# Patient Record
Sex: Female | Born: 1955
Health system: Southern US, Community
[De-identification: ages and names within clinical notes are randomized; demographics above are authoritative.]

## PROBLEM LIST (undated history)

## (undated) DIAGNOSIS — E1169 Type 2 diabetes mellitus with other specified complication: Secondary | ICD-10-CM

## (undated) DIAGNOSIS — I10 Essential (primary) hypertension: Secondary | ICD-10-CM

## (undated) DIAGNOSIS — E669 Obesity, unspecified: Secondary | ICD-10-CM

## (undated) HISTORY — DX: Obesity, unspecified: E11.69

## (undated) HISTORY — DX: Obesity, unspecified: E66.9

---

## 2013-07-15 ENCOUNTER — Ambulatory Visit: Payer: Self-pay | Admitting: Interventional Cardiology

## 2013-07-16 ENCOUNTER — Ambulatory Visit (INDEPENDENT_AMBULATORY_CARE_PROVIDER_SITE_OTHER): Payer: BC Managed Care – PPO | Admitting: Interventional Cardiology

## 2013-07-16 ENCOUNTER — Encounter: Payer: Self-pay | Admitting: Interventional Cardiology

## 2013-07-16 VITALS — BP 180/92 | HR 92 | Ht 60.0 in | Wt 166.0 lb

## 2013-07-16 DIAGNOSIS — I10 Essential (primary) hypertension: Secondary | ICD-10-CM

## 2013-07-16 DIAGNOSIS — E782 Mixed hyperlipidemia: Secondary | ICD-10-CM

## 2013-07-16 DIAGNOSIS — R011 Cardiac murmur, unspecified: Secondary | ICD-10-CM | POA: Insufficient documentation

## 2013-07-16 MED ORDER — LOSARTAN POTASSIUM-HCTZ 50-12.5 MG PO TABS: 1.0000 | ORAL_TABLET | Freq: Two times a day (BID) | ORAL | Status: DC

## 2013-07-16 MED ORDER — METOPROLOL TARTRATE 25 MG PO TABS: ORAL_TABLET | ORAL | Status: DC

## 2013-07-16 NOTE — Patient Instructions (Signed)
Your physician has recommended you make the following change in your medication:   1. Start Metoprolol Tartrate 25 mg 1 tablet twice a day as needed for systolic BP greater than 150.   Your physician recommends that you return for a FASTING lipid profile and cmet on 07/22/13.   Your physician wants you to follow-up in: 6 months with Dr. Eldridge DaceVaranasi. You will receive a reminder letter in the mail two months in advance. If you don't receive a letter, please call our office to schedule the follow-up appointment.  Your physician has requested that you regularly monitor and record your blood pressure readings at home. Please use the same machine at the same time of day to check your readings and record them to bring to your follow-up visit. Call is BP is consistently above 150-140's systolic.

## 2013-07-16 NOTE — Progress Notes (Signed)
Patient ID: Elizabeth [name], female   DOB: 10/02/1955, 58 y.o.   MRN: [id number]     Patient ID: Elizabeth [name] MRN: [id number] DOB/AGE: 57/05/1955 58 y.o.   Referring Physician no PCP yet   Reason for Consultation HTN  HPI: 58 y/o who had HTN.  She describes herself as anxious and this makes the BP increase as well.  She has been taken to the hospital due to severely elevated BP.  She has trouble sleeping.  BP [place] was 130/80.  Once a month, she may have a spike as described.  She reports headache with the high readings.    She had a kidney stone and had lithotripsy.  She uses some cystone for her pain.  No CP, SHOB, edema.  Her only sx are when the BP is up.  She has had difficulty losing weight.   Her cholesterol has been high in the past.  It has not been checked recently.     Current Outpatient Prescriptions  Medication Sig Dispense Refill  . losartan-hydrochlorothiazide (HYZAAR) 50-12.5 MG per tablet Take 1 tablet by mouth 2 (two) times daily.       No current facility-administered medications for this visit.   No past medical history on file.  Family History  Problem Relation Age of Onset  . Hypertension Mother     History   Social History  . Marital Status: Married    Spouse Name: N/A    Number of Children: N/A  . Years of Education: N/A   Occupational History  . Not on file.   Social History Main Topics  . Smoking status: Never Smoker   . Smokeless tobacco: Not on file  . Alcohol Use: No  . Drug Use: Not on file  . Sexual Activity: Not on file   Other Topics Concern  . Not on file   Social History Narrative  . No narrative on file    Past Surgical History  Procedure Laterality Date  . Cesarean section        (Not in a hospital admission)  Review of systems complete and found to be negative unless listed above .  No nausea, vomiting.  No fever chills, No focal weakness,  No palpitations.  Physical Exam: Filed Vitals:   07/16/13 0828   BP: 180/92  Pulse: 92    Weight: 166 lb (75.297 kg)  Physical exam:  [place]/AT EOMI No JVD, No carotid bruit RRR S1S2 , 2/6 systolic murmur No wheezing Soft. NT, nondistended No edema. No focal motor or sensory deficits Normal affect  Labs:   No results found for this basename: WBC, HGB, HCT, MCV, PLT   No results found for this basename: NA, K, CL, CO2, BUN, CREATININE, CALCIUM, LABALBU, PROT, BILITOT, ALKPHOS, ALT, AST, GLUCOSE,  in the last 168 hours No results found for this basename: CKTOTAL, CKMB, CKMBINDEX, TROPONINI    No results found for this basename: CHOL   No results found for this basename: HDL   No results found for this basename: LDLCALC   No results found for this basename: TRIG   No results found for this basename: CHOLHDL   No results found for this basename: LDLDIRECT      Radiology:none EKG: NSR, no ST changes  ASSESSMENT AND PLAN:  HTN: Continue losartan/HCTZ 20/12.5 twice a day. We'll start metoprolol 25 mg by mouth twice a day when necessary for the blood pressure spikes that she has. These are rare, approximately once a month. Continue  regular exercise. Continue to watch salt intake.  Murmur: She has had an echocardiogram in the past. The murmur sounds innocent. She has no symptoms or signs of heart failure. We will follow clinically.  Obtain old echo result when they have it.  Anxiety: She is somewhat nervous about her 57 year old son. He was diagnosed with an enlarged heart.  They would like For me to see him at a later time.  High cholesterol: We'll check lipid panel. Signed:   Fredric MareJay S. Khamora Karan, MD, [facility] 07/16/2013, 9:17 AM

## 2013-07-22 ENCOUNTER — Other Ambulatory Visit (INDEPENDENT_AMBULATORY_CARE_PROVIDER_SITE_OTHER): Payer: BC Managed Care – PPO

## 2013-07-22 DIAGNOSIS — E782 Mixed hyperlipidemia: Secondary | ICD-10-CM

## 2013-07-22 LAB — COMPREHENSIVE METABOLIC PANEL
ALBUMIN: 3.9 g/dL (ref 3.5–5.2)
ALT: 15 U/L (ref 0–35)
AST: 16 U/L (ref 0–37)
Alkaline Phosphatase: 60 U/L (ref 39–117)
BUN: 10 mg/dL (ref 6–23)
CO2: 29 mEq/L (ref 19–32)
Calcium: 9.6 mg/dL (ref 8.4–10.5)
Chloride: 102 mEq/L (ref 96–112)
Creatinine, Ser: 0.8 mg/dL (ref 0.4–1.2)
GFR: 76.32 mL/min (ref >60.00)
GLUCOSE: 96 mg/dL (ref 70–99)
POTASSIUM: 3.3 meq/L — AB (ref 3.5–5.1)
SODIUM: 137 meq/L (ref 135–145)
TOTAL PROTEIN: 7.2 g/dL (ref 6.0–8.3)
Total Bilirubin: 0.5 mg/dL (ref 0.2–1.2)

## 2013-07-22 LAB — LIPID PANEL
CHOLESTEROL: 212 mg/dL — AB (ref 0–200)
HDL: 51.2 mg/dL (ref >39.00)
LDL Cholesterol: 129 mg/dL — ABNORMAL HIGH (ref 0–99)
TRIGLYCERIDES: 157 mg/dL — AB (ref 0.0–149.0)
Total CHOL/HDL Ratio: 4
VLDL: 31.4 mg/dL (ref 0.0–40.0)

## 2013-07-23 NOTE — Progress Notes (Signed)
Quick Note:  Preliminary report reviewed by triage nurse and sent to MD desk. ______ 

## 2013-07-27 ENCOUNTER — Other Ambulatory Visit: Payer: Self-pay | Admitting: Cardiology

## 2013-07-27 DIAGNOSIS — E876 Hypokalemia: Secondary | ICD-10-CM

## 2013-07-27 MED ORDER — POTASSIUM CHLORIDE CRYS ER 20 MEQ PO TBCR: 20.0000 meq | EXTENDED_RELEASE_TABLET | Freq: Every day | ORAL | Status: DC

## 2013-08-04 ENCOUNTER — Other Ambulatory Visit (INDEPENDENT_AMBULATORY_CARE_PROVIDER_SITE_OTHER): Payer: BC Managed Care – PPO

## 2013-08-04 DIAGNOSIS — E876 Hypokalemia: Secondary | ICD-10-CM

## 2013-08-04 LAB — BASIC METABOLIC PANEL
BUN: 10 mg/dL (ref 6–23)
CHLORIDE: 99 meq/L (ref 96–112)
CO2: 31 mEq/L (ref 19–32)
CREATININE: 0.8 mg/dL (ref 0.4–1.2)
Calcium: 9.9 mg/dL (ref 8.4–10.5)
GFR: 79.66 mL/min (ref >60.00)
Glucose, Bld: 113 mg/dL — ABNORMAL HIGH (ref 70–99)
POTASSIUM: 3.6 meq/L (ref 3.5–5.1)
Sodium: 137 mEq/L (ref 135–145)

## 2013-09-29 ENCOUNTER — Other Ambulatory Visit (HOSPITAL_COMMUNITY)
Admission: RE | Admit: 2013-09-29 | Discharge: 2013-09-29 | Disposition: A | Discharge status: Home / Self Care | Payer: BC Managed Care – PPO | Source: Ambulatory Visit | Attending: Internal Medicine | Admitting: Internal Medicine

## 2013-09-29 DIAGNOSIS — Z01419 Encounter for gynecological examination (general) (routine) without abnormal findings: Secondary | ICD-10-CM | POA: Insufficient documentation

## 2013-11-27 ENCOUNTER — Emergency Department (HOSPITAL_COMMUNITY): Payer: BC Managed Care – PPO

## 2013-11-27 ENCOUNTER — Emergency Department (HOSPITAL_COMMUNITY)
Admission: EM | Admit: 2013-11-27 | Discharge: 2013-11-28 | Discharge status: Against Medical Advice | Payer: BC Managed Care – PPO | Attending: Emergency Medicine | Admitting: Emergency Medicine

## 2013-11-27 ENCOUNTER — Telehealth: Payer: Self-pay | Admitting: Interventional Cardiology

## 2013-11-27 ENCOUNTER — Encounter (HOSPITAL_COMMUNITY): Payer: Self-pay | Admitting: Emergency Medicine

## 2013-11-27 DIAGNOSIS — H579 Unspecified disorder of eye and adnexa: Secondary | ICD-10-CM | POA: Insufficient documentation

## 2013-11-27 DIAGNOSIS — I10 Essential (primary) hypertension: Secondary | ICD-10-CM | POA: Diagnosis not present

## 2013-11-27 DIAGNOSIS — R079 Chest pain, unspecified: Secondary | ICD-10-CM | POA: Insufficient documentation

## 2013-11-27 HISTORY — DX: Essential (primary) hypertension: I10

## 2013-11-27 NOTE — Telephone Encounter (Signed)
Pts Husband calling to make Dr Eldridge Dace aware that his wife is experiencing chest pressure and increased sob.  Husband states that he took the pts BP and it was 198/110. No HR reported by Husband. Husband states that the pt describes her chest pressure as "having an elephant sitting on my chest." Husband states nothing relieves the pressure.  Husband states that the pt is having some visual disturbances and an excruciating HA, as well. Husband would like for Dr Eldridge Dace to see the pt in the office for the complaints.  Informed the Husband that Dr Eldridge Dace is not in the office today, but given her complaints, the safest thing would be for the pt to go to the ER for cardiac work-up and eval of HTN.  Advised Husband to call 911.  Husband states he will take the pt to [facility].  Informed the Husband that I will notify Trish, our cardmaster, to make her aware that the pt will be coming to the ED for chest pressure and HTN.  Husband verbalized understanding and agrees with this plan.  Trish notified.

## 2013-11-27 NOTE — Telephone Encounter (Signed)
The pts husband took the pt to the ER but they left after 1 hour because the husband stated they could not wait to be seen as they had to pick up their child. The husband is requesting an appointment for the pt to see Dr Eldridge Dace soon. He is strongly encouraged to take the pt back to the ER or another ER for a cardiac workup but he declined stating that they were told at the ER that it would be up to 6 hours before she would be seen and they cant wait that long. An appointment has been scheduled for the pt to see Dr Eldridge Dace on 9/22 at 8:45 and the pts husband is strongly advised to call 911 or to drive her to the ER if her symptoms persist or worsen, he verbalized understanding.

## 2013-11-27 NOTE — ED Notes (Signed)
Per pt family she has been having hypertension issues over the past week and over the past few days has developed chest pain and SOB. sts also severe HA and ruptured blood vessel in eye. Sent here by Dr. Jeanmarie Hubert.

## 2013-11-27 NOTE — Telephone Encounter (Signed)
New message    husband calling     C/o high blood pressure 180/100 @ 10:30 . Now 198/110 .   Heaviness in chest .

## 2013-11-27 NOTE — Telephone Encounter (Signed)
Follow up:    Per pt's hus took pt to ER @ 3 and were not seen went back home call in.  Per hus made med needs adjusting.

## 2013-12-01 ENCOUNTER — Encounter: Payer: Self-pay | Admitting: Interventional Cardiology

## 2013-12-01 ENCOUNTER — Ambulatory Visit (INDEPENDENT_AMBULATORY_CARE_PROVIDER_SITE_OTHER): Payer: BC Managed Care – PPO | Admitting: Interventional Cardiology

## 2013-12-01 VITALS — BP 168/82 | HR 97 | Ht 60.0 in | Wt 168.6 lb

## 2013-12-01 DIAGNOSIS — E782 Mixed hyperlipidemia: Secondary | ICD-10-CM

## 2013-12-01 DIAGNOSIS — R079 Chest pain, unspecified: Secondary | ICD-10-CM

## 2013-12-01 DIAGNOSIS — I10 Essential (primary) hypertension: Secondary | ICD-10-CM

## 2013-12-01 MED ORDER — AMLODIPINE BESYLATE 10 MG PO TABS: 5.0000 mg | ORAL_TABLET | Freq: Every day | ORAL | Status: DC

## 2013-12-01 NOTE — Progress Notes (Signed)
Patient ID: Elizabeth [name], female   DOB: 19-Apr-1955, 58 y.o.   MRN: [id number] Patient ID: Elizabeth [name], female   DOB: [birth year], 58 y.o.   MRN: [id number]     Patient ID: Elizabeth [name] MRN: [id number] DOB/AGE: Jun 19, 1955 58 y.o.   Referring Physician no PCP   Reason for Visit HTN  HPI: 58 y/o who had HTN.  She describes herself as anxious and this makes the BP increase as well.  She has been taken to the hospital due to severely elevated BP.  She has trouble sleeping.  BP at home in [place] was 130/80.  Once a month, she may have a spike as described.  She reports headache with the high readings.    She had a kidney stone and had lithotripsy.  She uses some cystone for her pain.  No CP, SHOB, edema.  Her only sx are when the BP is up.  She has had difficulty losing weight.   Her cholesterol has been high in the past.  It has not been checked recently.    Since her last visit, she went to the ER 3 days ago with high BP and chest pain.  She had BPs in the 190-200 mm Hg systolic range.  She has some heaviness in her chest at times, not related to exertion.      Current Outpatient Prescriptions  Medication Sig Dispense Refill  . losartan-hydrochlorothiazide (HYZAAR) 50-12.5 MG per tablet Take 1 tablet by mouth 2 (two) times daily.  60 tablet  6  . metoprolol tartrate (LOPRESSOR) 25 MG tablet 1 tablet twice a day as needed for systolic BP greater than 150  30 tablet  3  . potassium chloride SA (KLOR-CON M20) 20 MEQ tablet Take 1 tablet (20 mEq total) by mouth daily.  30 tablet  6   No current facility-administered medications for this visit.   Past Medical History  Diagnosis Date  . Hypertension     Family History  Problem Relation Age of Onset  . Hypertension Mother     History   Social History  . Marital Status: Married    Spouse Name: N/A    Number of Children: N/A  . Years of Education: N/A   Occupational History  . Not on file.   Social History Main Topics  .  Smoking status: Never Smoker   . Smokeless tobacco: Not on file  . Alcohol Use: No  . Drug Use: Not on file  . Sexual Activity: Not on file   Other Topics Concern  . Not on file   Social History Narrative  . No narrative on file    Past Surgical History  Procedure Laterality Date  . Cesarean section        (Not in a hospital admission)  Review of systems complete and found to be negative unless listed above .  No nausea, vomiting.  No fever chills, No focal weakness,  No palpitations.  Physical Exam: Filed Vitals:   12/01/13 0853  BP: 168/82  Pulse: 97    Weight: 168 lb 9.6 oz (76.476 kg)  Physical exam:  [place]/AT EOMI No JVD, No carotid bruit RRR S1S2 , 2/6 systolic murmur No wheezing Soft. NT, nondistended No edema. No focal motor or sensory deficits Normal affect  Labs:   No results found for this basename: WBC,  HGB,  HCT,  MCV,  PLT   No results found for this basename: NA, K, CL, CO2, BUN, CREATININE, CALCIUM, LABALBU, PROT, BILITOT, ALKPHOS, ALT,  AST, GLUCOSE,  in the last 168 hours No results found for this basename: CKTOTAL,  CKMB,  CKMBINDEX,  TROPONINI    Lab Results  Component Value Date   CHOL 212* 07/22/2013   Lab Results  Component Value Date   HDL 51.20 07/22/2013   Lab Results  Component Value Date   LDLCALC 129* 07/22/2013   Lab Results  Component Value Date   TRIG 157.0* 07/22/2013   Lab Results  Component Value Date   CHOLHDL 4 07/22/2013   No results found for this basename: LDLDIRECT      Radiology:none EKG: NSR, no ST changes  ASSESSMENT AND PLAN:  HTN: Continue losartan/HCTZ 20/12.5 twice a day. She metoprolol 25 mg by mouth twice a day when necessary for the blood pressure spikes that she has. These are more frequent, and BP has ben continuously running higher. She needs to get back to regular exercise- she has been avoiding due to high BP. Continue to watch salt intake.  Start Amlodipine 5 mg daily.  I anticiate she will need  10 mg daily.  If readings stay > 140/90 after 3 days of the 5 mg, will increase to 10 mg daily.  If BP still high after that, would change losartan to irbesartan, and consider changing HCTZ to chlorthalidone.  I reviewed her ECG and CXR which were ok.  Husband has mentioned to me in the past that she is under a lot of emitioonal stressed due to a strained relationship with one of their sons, not the one mentioned below.    Murmur: She has had an echocardiogram in the past. The murmur sounds innocent. She has no symptoms or signs of heart failure. We will follow clinically.  Obtain old echo result when they have it.  Anxiety: She is somewhat nervous about her 13 year old son. He was diagnosed with an enlarged heart.  He has hypertrophic cardiomyopathy and is being evaluated by EP.  High cholesterol: Borderline LDL.  TG elevated.    Chest pain:  Will see if this is better with lower BP.  Nonexertional.   Signed:   Fredric Mare, MD, [facility] 12/01/2013, 9:01 AM

## 2013-12-01 NOTE — Patient Instructions (Signed)
Your physician has recommended you make the following change in your medication:   1. Start Amlodipine 10 mg 1/2 tablet daily.   Your physician has requested that you regularly monitor and record your blood pressure readings at home. Please use the same machine at the same time of day to check your readings and record them. Call with BP readings next week.   Our goal is for BP to be below 140/90 consistently.   Your physician recommends that you follow up as scheduled.

## 2014-01-14 ENCOUNTER — Encounter: Payer: Self-pay | Admitting: Interventional Cardiology

## 2014-01-14 ENCOUNTER — Ambulatory Visit (INDEPENDENT_AMBULATORY_CARE_PROVIDER_SITE_OTHER): Payer: BC Managed Care – PPO | Admitting: Interventional Cardiology

## 2014-01-14 ENCOUNTER — Other Ambulatory Visit: Payer: Self-pay

## 2014-01-14 VITALS — BP 160/80 | HR 99 | Ht 60.0 in | Wt 169.0 lb

## 2014-01-14 DIAGNOSIS — E782 Mixed hyperlipidemia: Secondary | ICD-10-CM

## 2014-01-14 DIAGNOSIS — I10 Essential (primary) hypertension: Secondary | ICD-10-CM

## 2014-01-14 MED ORDER — METOPROLOL TARTRATE 25 MG PO TABS: ORAL_TABLET | ORAL | Status: DC

## 2014-01-14 MED ORDER — AMLODIPINE BESYLATE 10 MG PO TABS: 10.0000 mg | ORAL_TABLET | Freq: Every day | ORAL | Status: DC

## 2014-01-14 MED ORDER — CARVEDILOL 3.125 MG PO TABS: 3.1250 mg | ORAL_TABLET | Freq: Two times a day (BID) | ORAL | Status: DC

## 2014-01-14 NOTE — Patient Instructions (Signed)
INCREASE YOUR AMLODIPINE TO 10 MG DAILY  DO NOT GET YOUR METOPROLOL FILLED  START CARVEDILOL 3.125 MG TWICE A DAY  CALL THE OFFICE IN 1 WEEK WITH YOUR BLOOD PRESSURE READINGS  Your physician wants you to follow-up in: 6 MONTH OV  You will receive a reminder letter in the mail two months in advance. If you don't receive a letter, please call our office to schedule the follow-up appointment.

## 2014-01-14 NOTE — Progress Notes (Signed)
Patient ID: Elizabeth [name] Chalmers, female   DOB: 05/30/1955, 58 y.o.   MRN: [id number] Patient ID: Elizabeth [name] Krauss, female   DOB: 06/05/1955, 58 y.o.   MRN: [id number] Patient ID: Elizabeth [name] Tappen, female   DOB: 02/12/1956, 58 y.o.   MRN: [id number]     Patient ID: Elizabeth [name] Bold MRN: [id number] DOB/AGE: 57/05/1955 58 y.o.   Referring Physician no PCP   Reason for Visit HTN  HPI: 58 y/o who had HTN.  She describes herself as anxious and this makes the BP increase as well.  She has been taken to the hospital due to severely elevated BP.  She has trouble sleeping.  BP at home in [place] was 130/80.  Once a month, she may have a spike as described.  She reports headache with the high readings.    She had a kidney stone and had lithotripsy.  She uses some cystone for her pain.  No CP, SHOB, edema.  Her only sx are when the BP is up.  She has had difficulty losing weight.   Her cholesterol has been high in the past.  It has not been checked recently.    Since her last visit, she went to the ER 3 days ago with high BP and chest pain.  She had BPs in the 190-200 mm Hg systolic range.  She has some heaviness in her chest at times, not related to exertion.      Current Outpatient Prescriptions  Medication Sig Dispense Refill  . amLODipine (NORVASC) 10 MG tablet Take 0.5 tablets (5 mg total) by mouth daily. Or as directed 30 tablet 6  . losartan-hydrochlorothiazide (HYZAAR) 50-12.5 MG per tablet Take 1 tablet by mouth 2 (two) times daily. 60 tablet 6  . potassium chloride SA (KLOR-CON M20) 20 MEQ tablet Take 1 tablet (20 mEq total) by mouth daily. 30 tablet 6  . metoprolol tartrate (LOPRESSOR) 25 MG tablet 1 tablet twice a day as needed for systolic BP greater than 150 30 tablet 3   No current facility-administered medications for this visit.   Past Medical History  Diagnosis Date  . Hypertension     Family History  Problem Relation Age of Onset  . Hypertension Mother     History   Social History  .  Marital Status: Married    Spouse Name: N/A    Number of Children: N/A  . Years of Education: N/A   Occupational History  . Not on file.   Social History Main Topics  . Smoking status: Never Smoker   . Smokeless tobacco: Not on file  . Alcohol Use: No  . Drug Use: Not on file  . Sexual Activity: Not on file   Other Topics Concern  . Not on file   Social History Narrative    Past Surgical History  Procedure Laterality Date  . Cesarean section        (Not in a hospital admission)  Review of systems complete and found to be negative unless listed above .  No nausea, vomiting.  No fever chills, No focal weakness,  No palpitations.  Physical Exam: Filed Vitals:   01/14/14 1601  BP: 160/80  Pulse: 99    Weight: 169 lb (76.658 kg)  Physical exam:  [place]/AT EOMI No JVD, No carotid bruit RRR S1S2 , 2/6 systolic murmur No wheezing Soft. NT, nondistended No edema. No focal motor or sensory deficits Normal affect  Labs:   No results found for: WBC No results for input(s): NA, K, CL,  CO2, BUN, CREATININE, CALCIUM, PROT, BILITOT, ALKPHOS, ALT, AST, GLUCOSE in the last 168 hours.  Invalid input(s): LABALBU No results found for: CKTOTAL  Lab Results  Component Value Date   CHOL 212* 07/22/2013   Lab Results  Component Value Date   HDL 51.20 07/22/2013   Lab Results  Component Value Date   LDLCALC 129* 07/22/2013   Lab Results  Component Value Date   TRIG 157.0* 07/22/2013   Lab Results  Component Value Date   CHOLHDL 4 07/22/2013   No results found for: LDLDIRECT    Radiology:none EKG: NSR, no ST changes  ASSESSMENT AND PLAN:  HTN: Continue losartan/HCTZ 20/12.5 twice a day. She was taking metoprolol 25 mg by mouth twice a day when necessary for the blood pressure spikes that she has. These are more frequent, and BP has ben continuously running higher. She ran out of metoprolol. She needs to get back to regular exercise- she has been avoiding due to high  BP. Continue to watch salt intake.  Increase Amlodipine to 10 mg daily.  Start Carvedilol 3.125 mg BID.  If readings stay > 140/90 after 3 days of the 5 mg, will increase to 6.25 mg BID. They should call with additional    If BP still high after that, would change losartan to irbesartan, and consider changing HCTZ to chlorthalidone.  Husband has mentioned to me in the past that she is under a lot of emitioonal stressed due to a strained relationship with one of their sons, not the one mentioned below.    Murmur: She has had an echocardiogram in the past. The murmur sounds innocent. She has no symptoms or signs of heart failure. We will follow clinically.  Obtain old echo result when they have it.  THey are still unpacking their old things into their new house.  Anxiety: She is somewhat nervous about her 57 year old son. He was diagnosed with an enlarged heart.  He has hypertrophic cardiomyopathy and is being evaluated by EP.  He had a stress test and MRI.  High cholesterol: Borderline LDL.  TG elevated.  Increase exercise. COntinue to follow.    Signed:   Fredric MareJay S. Garritt Molyneux, MD, [facility] 01/14/2014, 4:23 PM

## 2014-02-16 ENCOUNTER — Other Ambulatory Visit: Payer: Self-pay | Admitting: *Deleted

## 2014-02-16 MED ORDER — LOSARTAN POTASSIUM-HCTZ 50-12.5 MG PO TABS: 1.0000 | ORAL_TABLET | Freq: Two times a day (BID) | ORAL | Status: DC

## 2014-04-01 ENCOUNTER — Other Ambulatory Visit: Payer: Self-pay | Admitting: Internal Medicine

## 2014-04-01 DIAGNOSIS — I10 Essential (primary) hypertension: Secondary | ICD-10-CM

## 2014-04-06 ENCOUNTER — Ambulatory Visit: Payer: Self-pay | Admitting: Pharmacist

## 2014-04-08 ENCOUNTER — Encounter: Payer: Self-pay | Admitting: Interventional Cardiology

## 2014-04-08 ENCOUNTER — Encounter: Payer: Self-pay | Admitting: Pharmacist Clinician (PhC)/ Clinical Pharmacy Specialist

## 2014-04-08 ENCOUNTER — Ambulatory Visit (INDEPENDENT_AMBULATORY_CARE_PROVIDER_SITE_OTHER): Payer: 59 | Admitting: Pharmacist Clinician (PhC)/ Clinical Pharmacy Specialist

## 2014-04-08 ENCOUNTER — Ambulatory Visit (INDEPENDENT_AMBULATORY_CARE_PROVIDER_SITE_OTHER): Payer: 59 | Admitting: Interventional Cardiology

## 2014-04-08 VITALS — BP 150/76 | HR 61 | Ht 60.0 in | Wt 168.1 lb

## 2014-04-08 VITALS — BP 122/78 | Ht 60.0 in | Wt 168.1 lb

## 2014-04-08 DIAGNOSIS — I10 Essential (primary) hypertension: Secondary | ICD-10-CM

## 2014-04-08 NOTE — Progress Notes (Signed)
Patient ID: Elizabeth [name] Pellot, female   DOB: 08/17/1955, 59 y.o.   MRN: [id number]     Patient ID: Elizabeth [name] Cilia MRN: [id number] DOB/AGE: 58/05/1955 59 y.o.   Referring Physician Dr. Nicholos Johnsamachandran   Reason for Visit HTN  HPI: 59 y/o who had HTN.  She describes herself as anxious and this makes the BP increase as well.  She has been taken to the hospital due to severely elevated BP.  She has trouble sleeping.  BP at home [place] was 130/80.  Once a month, she may have a spike as described.  She reports headache with the high readings.    She had a kidney stone [place]  She uses some cystone for her pain.  No CP, SHOB, edema.  Her only sx are when the BP is up.  She has had difficulty losing weight.   Her cholesterol has been high in the past.  It has not been checked recently.    Several months ago, she went to the ER 3 days ago with high BP and chest pain.  She had BPs in the 190-200 mm Hg systolic range.  She has some heaviness in her chest at times, not related to exertion.     She has had diastolic BPs elevated.  Dr. Nicholos Johnsamachandran added Tenex on 04/07/14.  She has had renal artery Duplex ordered.  Diastolic today was better.     Current Outpatient Prescriptions  Medication Sig Dispense Refill  . amLODipine (NORVASC) 10 MG tablet Take 1 tablet (10 mg total) by mouth daily. Or as directed 30 tablet 6  . carvedilol (COREG) 3.125 MG tablet Take 1 tablet (3.125 mg total) by mouth 2 (two) times daily. 60 tablet 5  . guanFACINE (TENEX) 1 MG tablet Take 1 mg by mouth at bedtime.  0  . losartan-hydrochlorothiazide (HYZAAR) 50-12.5 MG per tablet Take 1 tablet by mouth 2 (two) times daily. 60 tablet 6  . metoprolol tartrate (LOPRESSOR) 25 MG tablet Take 25 mg by mouth daily.  0  . potassium chloride SA (KLOR-CON M20) 20 MEQ tablet Take 1 tablet (20 mEq total) by mouth daily. 30 tablet 6   No current facility-administered medications for this visit.   Past Medical  History  Diagnosis Date  . Hypertension     Family History  Problem Relation Age of Onset  . Hypertension Mother     History   Social History  . Marital Status: Married    Spouse Name: N/A    Number of Children: N/A  . Years of Education: N/A   Occupational History  . Not on file.   Social History Main Topics  . Smoking status: Never Smoker   . Smokeless tobacco: Not on file  . Alcohol Use: No  . Drug Use: Not on file  . Sexual Activity: Not on file   Other Topics Concern  . Not on file   Social History Narrative    Past Surgical History  Procedure Laterality Date  . Cesarean section        (Not in a hospital admission)  Review of systems complete and found to be negative unless listed above .  No nausea, vomiting.  No fever chills, No focal weakness,  No palpitations.  Physical Exam: Filed Vitals:   04/08/14 1116  BP: 150/76  Pulse: 61    Weight: 168 lb 1.9 oz (76.259 kg)  Physical exam:  [place]/AT EOMI No JVD, No carotid bruit RRR S1S2 , 2/6  systolic murmur No wheezing Soft. NT, nondistended No edema. No focal motor or sensory deficits Anxious affect  Labs:   No results found for: WBC No results for input(s): NA, K, CL, CO2, BUN, CREATININE, CALCIUM, PROT, BILITOT, ALKPHOS, ALT, AST, GLUCOSE in the last 168 hours.  Invalid input(s): LABALBU No results found for: CKTOTAL  Lab Results  Component Value Date   CHOL 212* 07/22/2013   Lab Results  Component Value Date   HDL 51.20 07/22/2013   Lab Results  Component Value Date   LDLCALC 129* 07/22/2013   Lab Results  Component Value Date   TRIG 157.0* 07/22/2013   Lab Results  Component Value Date   CHOLHDL 4 07/22/2013   No results found for: LDLDIRECT    Radiology:none EKG: NSR, no ST changes  ASSESSMENT AND PLAN:  HTN: Continue losartan/HCTZ 20/12.5 twice a day. She was taking metoprolol 25 mg by mouth twice a day when necessary for the blood pressure spikes that she has. These are  more frequent, and BP has ben continuously running higher. She ran out of metoprolol. She needs to get back to regular exercise- she has been avoiding due to high BP. Continue to watch salt intake.  Increased Amlodipine to 10 mg daily.  Started Carvedilol 3.125 mg BID.  Tenex was started yesterday.  I don't want to make any new changes since there was a change made yesterday.  If readings are > 140/90, would increase Carvedilol to 6.25 mg BID. They should call with additional BP readings.  They are also in the resident pharmacy HTN project.   If BP still high after that, would change losartan to irbesartan, and consider changing HCTZ to chlorthalidone.  Husband has mentioned to me in the past that she is under a lot of emitional stressed due to a strained relationship with one of their sons, not the one mentioned below.    Murmur: She has had an echocardiogram in the past. The murmur sounds innocent. She has no symptoms or signs of heart failure. We will follow clinically.  Obtain old echo result when they have it.  THey are still unpacking their old things into their new house.  Anxiety: She is somewhat nervous about her 62 year old son. He was diagnosed with an enlarged heart.  He has hypertrophic cardiomyopathy and is being evaluated by EP.  He had a stress test and MRI.  High cholesterol: Borderline LDL.  TG elevated.  Increase exercise. COntinue to follow.    Signed:   Fredric Mare, MD, [facility] 04/08/2014, 11:39 AM

## 2014-04-08 NOTE — Progress Notes (Signed)
Pharmacist Hypertension Clinic - Resident Research Study  S/O: Elizabeth [name] is a 59 y.o. female presenting to clinic today for initial study visit and referred to hypertension clinic by Dr. Eldridge DaceVaranasi.  Her husband is also part of this same study.  She has apparently had hypertension for many years, treated in [place] before coming here in the past couple of years.  Her home BP readings have been as high as 200 systolic.  She apparently saw or spoke with Dr. Nicholos Johnsamachandran yesterday and was started on guanfacine 1 mg nightly.  Took her first dose yesterday and does note some dizziness today.  Current HTN meds: Amlodipine 10 mg qd Carvedilol 3.125 mg bid Losartan/hctz 50/12.5 mg bid Guanfacine 1 mg  FH: mother with hypertension  SH: does not drink alcohol or caffeine (1 cup tea/day), has never used tobacco products  Wt Readings from Last 3 Encounters:  04/08/14 168 lb 1.6 oz (76.25 kg)  04/08/14 168 lb 1.9 oz (76.259 kg)  01/14/14 169 lb (76.658 kg)   BP Readings from Last 3 Encounters:  04/08/14 122/78  04/08/14 150/76  01/14/14 160/80   Pulse Readings from Last 3 Encounters:  04/08/14 61  01/14/14 99  12/01/13 97    Renal function: CrCl 93 (based on 07/2013 labs)  Outpatient Encounter Prescriptions as of 04/08/2014  Medication Sig Note  . amLODipine (NORVASC) 10 MG tablet Take 1 tablet (10 mg total) by mouth daily. Or as directed   . carvedilol (COREG) 3.125 MG tablet Take 1 tablet (3.125 mg total) by mouth 2 (two) times daily.   Marland Kitchen. guanFACINE (TENEX) 1 MG tablet Take 1 mg by mouth at bedtime. 04/08/2014: Received from: External Pharmacy  . losartan-hydrochlorothiazide (HYZAAR) 50-12.5 MG per tablet Take 1 tablet by mouth 2 (two) times daily.   . metoprolol tartrate (LOPRESSOR) 25 MG tablet Take 25 mg by mouth daily. 04/08/2014: Received from: External Pharmacy Received Sig:   . potassium chloride SA (KLOR-CON M20) 20 MEQ tablet Take 1 tablet (20 mEq total) by mouth daily.      Allergies: No Known Allergies  A/P: Pt has qualified for study enrollment and has signed informed consent. Pt was randomized to study group: telephonic group  Pt is at BP goal of < 140/90 mmHg.   Continue with current medications.

## 2014-04-08 NOTE — Patient Instructions (Signed)
Your physician recommends that you schedule a follow-up appointment pending visit with PharmD with BP Clinic.

## 2014-04-12 ENCOUNTER — Ambulatory Visit
Admission: RE | Admit: 2014-04-12 | Discharge: 2014-04-12 | Disposition: A | Discharge status: Home / Self Care | Payer: 59 | Source: Ambulatory Visit | Attending: Internal Medicine | Admitting: Internal Medicine

## 2014-04-12 DIAGNOSIS — I10 Essential (primary) hypertension: Secondary | ICD-10-CM

## 2014-05-07 ENCOUNTER — Telehealth: Payer: Self-pay | Admitting: Pharmacist

## 2014-05-07 DIAGNOSIS — I10 Essential (primary) hypertension: Secondary | ICD-10-CM

## 2014-05-07 MED ORDER — LOSARTAN POTASSIUM-HCTZ 50-12.5 MG PO TABS: 1.0000 | ORAL_TABLET | Freq: Two times a day (BID) | ORAL | Status: DC

## 2014-05-07 NOTE — Telephone Encounter (Signed)
Pharmacist Hypertension Clinic - Resident Research Study  S/O: Elizabeth [name] is a 59 y.o. female.  Calling pt today for 4wk f/u telephone visit, referred to hypertension clinic by Dr. Eldridge DaceVaranasi.  Her husband is also part of this same study.  She has apparently had hypertension for many years, treated in [place] before coming here in the past couple of years. Dr. Nicholos Johnsamachandran started pt on guanfacine 1mg  nightly in Jan 2016, and she has seen "marked improvement in blood pressure" since starting that med about a month ago.  She takes Lopressor 25mg  as needed when SBP > 150 - taking rarely now.  Renal ultrasound from 04/12/2014: "no renal artery stenosis."  Current HTN meds: Amlodipine 10mg  daily Carvedilol 3.125mg  BID Losartan/HCTZ 50/12.5mg  BID Guanfacine 1mg  QHS  FH: mother with hypertension  SH: does not drink alcohol or caffeine (1 cup tea/day), has never used tobacco products  Wt Readings from Last 3 Encounters:  04/08/14 168 lb 1.6 oz (76.25 kg)  04/08/14 168 lb 1.9 oz (76.259 kg)  01/14/14 169 lb (76.658 kg)   BP Readings from Last 3 Encounters:  04/08/14 141/85  04/08/14 150/76  01/14/14 160/80   Pulse Readings from Last 3 Encounters:  04/08/14 61  01/14/14 99  12/01/13 97    Renal function: CrCl ~70 mL/min (based on 07/2013 labs), lytes ok  Outpatient Encounter Prescriptions as of 05/07/2014  Medication Sig Note  . amLODipine (NORVASC) 10 MG tablet Take 1 tablet (10 mg total) by mouth daily. Or as directed   . carvedilol (COREG) 3.125 MG tablet Take 1 tablet (3.125 mg total) by mouth 2 (two) times daily.   Marland Kitchen. guanFACINE (TENEX) 1 MG tablet Take 1 mg by mouth at bedtime. 04/08/2014: Received from: External Pharmacy  . losartan-hydrochlorothiazide (HYZAAR) 50-12.5 MG per tablet Take 1 tablet by mouth 2 (two) times daily.   . metoprolol tartrate (LOPRESSOR) 25 MG tablet Take 25 mg by mouth daily as needed (Taking when SBP > 150).  04/08/2014: Received from: External Pharmacy  Received Sig:   . potassium chloride SA (KLOR-CON M20) 20 MEQ tablet Take 1 tablet (20 mEq total) by mouth daily.   . [DISCONTINUED] losartan-hydrochlorothiazide (HYZAAR) 50-12.5 MG per tablet Take 1 tablet by mouth 2 (two) times daily.     Allergies: No Known Allergies  A/P: Pt has qualified for study enrollment and has signed informed consent. Pt was randomized to study group: Home BP monitoring and telephone f/u  Pt is at BP goal of < 140/90 mmHg.   Continue with current medications. Keep track of how many times you use Lopressor over the next 4 weeks. Follow-up phone visit in 4 weeks  Future visits: if pt continues to use Lopressor for SBP > 150, may consider D/C Lopressor and increasing Coreg dose instead.  Waynette Butteryegan K. Anniebelle Devore, PharmD Clinical Pharmacy Resident

## 2014-06-11 ENCOUNTER — Telehealth: Payer: Self-pay | Admitting: Pharmacist

## 2014-06-11 DIAGNOSIS — I10 Essential (primary) hypertension: Secondary | ICD-10-CM

## 2014-06-11 MED ORDER — CARVEDILOL 6.25 MG PO TABS: 6.2500 mg | ORAL_TABLET | Freq: Two times a day (BID) | ORAL | Status: DC

## 2014-06-11 NOTE — Telephone Encounter (Signed)
Pharmacist Hypertension Clinic - Resident Research Study  S/O: Elizabeth PaganFareeda [name] is a 59 y.o. female.  Calling pt today for 8wk f/u telephone visit, referred to hypertension clinic by Dr. Eldridge DaceVaranasi.  Her husband is also part of this same study.  She has apparently had hypertension for many years, treated in [place] before coming here in the past couple of years. Dr. Nicholos Johnsamachandran started pt on guanfacine 1mg  nightly in Jan 2016, and she has seen "marked improvement in blood pressure" (per husband) since starting that med.  She has reportedly been taking Lopressor 25mg  BID prn SBP > 150.  During last visit I instructed pt to keep track of frequency of taking Lopressor.  She has apparently been taking this "almost every day" since her last visit.  Renal ultrasound from 04/12/2014: "no renal artery stenosis."  Current HTN meds: Amlodipine 10mg  daily Carvedilol 3.125mg  BID Losartan/HCTZ 50/12.5mg  BID Guanfacine 1mg  QHS Lopressor 25mg  BID prn SBP > 150  FH: mother with hypertension  SH: does not drink alcohol or caffeine (1 cup tea/day), has never used tobacco products  Wt Readings from Last 3 Encounters:  04/08/14 168 lb 1.6 oz (76.25 kg)  04/08/14 168 lb 1.9 oz (76.259 kg)  01/14/14 169 lb (76.658 kg)   BP Readings from Last 3 Encounters:  06/11/14 150/86  05/07/14 141/85  04/08/14 122/78   Pulse Readings from Last 3 Encounters:  04/08/14 61  01/14/14 99  12/01/13 97   Renal function: CrCl ~70 mL/min (based on 07/2013 labs), lytes ok  Outpatient Encounter Prescriptions as of 06/11/2014  Medication Sig Note  . amLODipine (NORVASC) 10 MG tablet Take 1 tablet (10 mg total) by mouth daily. Or as directed   . carvedilol (COREG) 6.25 MG tablet Take 1 tablet (6.25 mg total) by mouth 2 (two) times daily with a meal.   . guanFACINE (TENEX) 1 MG tablet Take 1 mg by mouth at bedtime. 04/08/2014: Received from: External Pharmacy  . losartan-hydrochlorothiazide (HYZAAR) 50-12.5 MG per tablet Take 1  tablet by mouth 2 (two) times daily.   . potassium chloride SA (KLOR-CON M20) 20 MEQ tablet Take 1 tablet (20 mEq total) by mouth daily.   . [DISCONTINUED] carvedilol (COREG) 3.125 MG tablet Take 1 tablet (3.125 mg total) by mouth 2 (two) times daily.   . [DISCONTINUED] metoprolol tartrate (LOPRESSOR) 25 MG tablet Take 25 mg by mouth daily as needed (Taking when SBP > 150).  06/11/2014: Has been taking almost daily    Allergies: No Known Allergies  A/P: Pt has qualified for study enrollment and has signed informed consent. Pt was randomized to study group: Home BP monitoring and telephone f/u  Pt is NOT at BP goal of < 140/90 mmHg.  Discontinue Lopressor Increase Coreg to 6.25mg  BID Continue monitoring home BP and recording results F/U in clinic w/ me 07/07/2014, bring BP log  Waynette Butteryegan K. Lianna Sitzmann, PharmD Clinical Pharmacy Resident

## 2014-07-07 ENCOUNTER — Ambulatory Visit: Payer: 59 | Admitting: Pharmacist

## 2014-07-13 ENCOUNTER — Ambulatory Visit: Payer: 59 | Admitting: Pharmacist

## 2014-07-22 ENCOUNTER — Ambulatory Visit: Payer: 59 | Admitting: Pharmacist

## 2014-08-10 ENCOUNTER — Encounter: Payer: Self-pay | Admitting: Pharmacist

## 2014-08-10 ENCOUNTER — Ambulatory Visit (INDEPENDENT_AMBULATORY_CARE_PROVIDER_SITE_OTHER): Payer: Self-pay | Admitting: Pharmacist

## 2014-08-10 VITALS — BP 136/76 | HR 68 | Wt 166.0 lb

## 2014-08-10 DIAGNOSIS — I10 Essential (primary) hypertension: Secondary | ICD-10-CM

## 2014-08-10 NOTE — Progress Notes (Signed)
Pharmacist Hypertension Clinic - Resident Research Study  S/O: Elizabeth [name] is a 59 y.o. female presenting today for 12wk f/u visit, referred to hypertension clinic by Dr. Eldridge DaceVaranasi.  Her husband is also part of this same study.  She has apparently had hypertension for many years, treated in [place] before coming here in the past couple of years. Dr. Nicholos Johnsamachandran started pt on guanfacine 1mg  nightly in Jan 2016, and she has seen "marked improvement in blood pressure" (per husband) since starting that med.  Pt states that she has been experiencing LEE for several months.  She saw her PCP about 2 weeks ago.  During that visit she was prescribed lasix w/ KCl supplement and discontinued HCTZ (was in combination losartan/HCTZ).  She has seen some improvement with LEE since this change.  Renal ultrasound from 04/12/2014: "no renal artery stenosis."  Current HTN meds: Amlodipine 10mg  daily Carvedilol 6.25mg  BID Losartan 100mg  daily Guanfacine 1mg  QHS Lasix 20mg  daily (started by PCP ~07/26/2014)    KCl 20mEq daily (started by PCP ~07/26/2014)  FH: mother with hypertension  SH: does not drink alcohol or caffeine (1 cup tea/day), has never used tobacco products  Wt Readings from Last 3 Encounters:  08/10/14 166 lb (75.297 kg)  04/08/14 168 lb 1.6 oz (76.25 kg)  04/08/14 168 lb 1.9 oz (76.259 kg)   BP Readings from Last 3 Encounters:  08/10/14 136/76  06/11/14 150/86  05/07/14 141/85   Pulse Readings from Last 3 Encounters:  08/10/14 68  04/08/14 61  01/14/14 99    Renal function: CrCl ~70 mL/min, lytes ok  Outpatient Encounter Prescriptions as of 08/10/2014  Medication Sig Note  . amLODipine (NORVASC) 10 MG tablet Take 1 tablet (10 mg total) by mouth daily. Or as directed   . carvedilol (COREG) 6.25 MG tablet Take 1 tablet (6.25 mg total) by mouth 2 (two) times daily with a meal.   . guanFACINE (TENEX) 1 MG tablet Take 1 mg by mouth at bedtime. 04/08/2014: Received from: External  Pharmacy  . losartan (COZAAR) 100 MG tablet Take 100 mg by mouth daily.   . potassium chloride SA (KLOR-CON M20) 20 MEQ tablet Take 1 tablet (20 mEq total) by mouth daily.   . [DISCONTINUED] losartan-hydrochlorothiazide (HYZAAR) 50-12.5 MG per tablet Take 1 tablet by mouth 2 (two) times daily.    No facility-administered encounter medications on file as of 08/10/2014.    Allergies: No Known Allergies  A/P: Pt has qualified for study enrollment and has signed informed consent. Pt was randomized to study group: Home BP monitoring and telephone f/u  Pt is at BP goal of < 140/90 mmHg.  Continue current medications as prescribed Monitor for improvement in LEE Work on increasing exercise and incorporating DASH diet recommendations Continue care with PCP and cardiologist  Future visits may consider discontinuation of amlodipine of LEE does not improve.  Waynette Butteryegan K. Breyon Sigg, PharmD Clinical Pharmacy Resident

## 2014-08-10 NOTE — Patient Instructions (Signed)
It has been a pleasure working with you these past few months.  Continue taking your medicines as prescribed We will check some blood work today I will call you with the results and let you know if we need to make any changes to your medicines Continue care with your primary care doctor and Dr. Varanasi  HOW TO TAKE YOUR BLOOD PRESSURE: -Rest 5 minutes before taking your blood pressure -Do not drink caffeinated beverages for at least 30 minutes before -Take your blood pressure before (not after) you eat -Sit comfortably with your back supported and both feet on the floor (don't cross your legs) -Elevate your arm to heart level on a table or a desk -There should be enough room to slip a fingertip under the cuff. The bottom edge of the cuff should be 1 inch above the crease of the elbow -Ideally, take 3 measurements at one sitting and record the average 

## 2014-08-11 LAB — BASIC METABOLIC PANEL
BUN: 12 mg/dL (ref 6–23)
CALCIUM: 9.7 mg/dL (ref 8.4–10.5)
CO2: 31 meq/L (ref 19–32)
Chloride: 103 mEq/L (ref 96–112)
Creatinine, Ser: 0.83 mg/dL (ref 0.40–1.20)
GFR: 74.98 mL/min (ref >60.00)
Glucose, Bld: 119 mg/dL — ABNORMAL HIGH (ref 70–99)
Potassium: 4 mEq/L (ref 3.5–5.1)
Sodium: 139 mEq/L (ref 135–145)

## 2015-01-31 ENCOUNTER — Ambulatory Visit: Payer: 59 | Admitting: Cardiology

## 2015-03-18 ENCOUNTER — Other Ambulatory Visit: Payer: Self-pay

## 2015-03-18 MED ORDER — CARVEDILOL 6.25 MG PO TABS: 6.2500 mg | ORAL_TABLET | Freq: Two times a day (BID) | ORAL | Status: DC

## 2015-04-21 NOTE — Progress Notes (Signed)
Patient ID: Elizabeth [name], female   DOB: 1955/12/10, 60 y.o.   MRN: [id number]     Cardiology Office Note   Date:  04/22/2015   ID:  Elizabeth [name], DOB 17-Mar-1955, MRN [id number]  PCP:  Georgianne Fick, MD    No chief complaint on file. f/u HTN   Wt Readings from Last 3 Encounters:  04/22/15 156 lb 6.4 oz (70.943 kg)  08/10/14 166 lb (75.297 kg)  04/08/14 168 lb 1.6 oz (76.25 kg)       History of Present Illness: Elizabeth [name] is a 60 y.o. female  who had HTN. She describes herself as anxious and this makes the BP increase as well. She has been taken to the hospital due to severely elevated BP. She has trouble sleeping. BP at home in [place] was 130/80. Once a month, she may have a spike as described. She reports headache with the high readings.   She had a kidney stone and had lithotripsy in [place]. She uses some cystone for her pain. No CP, SHOB, edema. Her only sx are when the BP is up. She has had difficulty losing weight.   Her cholesterol has been high in the past. It has not been checked recently.    Her blood pressure has been much better of late. Typically, at home, it is in the 130s to 140 systolic range. On occasion, she'll have a reading in the 150 range.    Past Medical History  Diagnosis Date  . Hypertension     Past Surgical History  Procedure Laterality Date  . Cesarean section       Current Outpatient Prescriptions  Medication Sig Dispense Refill  . amLODipine (NORVASC) 10 MG tablet Take 1 tablet (10 mg total) by mouth daily. Or as directed 30 tablet 6  . carvedilol (COREG) 6.25 MG tablet Take 1 tablet (6.25 mg total) by mouth 2 (two) times daily with a meal. 60 tablet 5  . fluticasone (FLONASE) 50 MCG/ACT nasal spray Place 2 sprays into both nostrils daily.   0  . guanFACINE (TENEX) 1 MG tablet Take 1 mg by mouth at bedtime. Reported on 04/22/2015  0  . losartan (COZAAR) 100 MG tablet Take 100 mg by mouth daily.    . penicillin v  potassium (VEETID) 500 MG tablet Take by mouth every 6 (six) hours. DENTAL  0   No current facility-administered medications for this visit.    Allergies:   Review of patient's allergies indicates no known allergies.    Social History:  The patient  reports that she has never smoked. She does not have any smokeless tobacco history on file. She reports that she does not drink alcohol.   Family History:  The patient's family history includes Hypertension in her mother. There is no history of Heart attack or Stroke.    ROS:  Please see the history of present illness.   Otherwise, review of systems are positive for leg swelling.   All other systems are reviewed and negative.    PHYSICAL EXAM: VS:  BP 140/70 mmHg  Pulse 60  Ht 5' (1.524 m)  Wt 156 lb 6.4 oz (70.943 kg)  BMI 30.54 kg/m2 , BMI Body mass index is 30.54 kg/(m^2). GEN: Well nourished, well developed, in no acute distress HEENT: normal Neck: no JVD, carotid bruits, or masses Cardiac: RRR; no murmurs, rubs, or gallops,no edema  Respiratory:  clear to auscultation bilaterally, normal work of breathing GI: soft, nontender, nondistended, + BS MS: no deformity  or atrophy Skin: warm and dry, no rash Neuro:  Strength and sensation are intact Psych: euthymic mood, full affect   Recent Labs: 08/10/2014: BUN 12; Creatinine, Ser 0.83; Potassium 4.0; Sodium 139   Lipid Panel    Component Value Date/Time   CHOL 212* 07/22/2013 1228   TRIG 157.0* 07/22/2013 1228   HDL 51.20 07/22/2013 1228   CHOLHDL 4 07/22/2013 1228   VLDL 31.4 07/22/2013 1228   LDLCALC 129* 07/22/2013 1228     Other studies Reviewed: Additional studies/ records that were reviewed today with results demonstrating:  Prior high cholesterol.   ASSESSMENT AND PLAN:  1. HTN:  Add chlorthalidone 25 mg daily to help with blood pressure lowering.   Increased to 50 mg of her blood pressures stay elevated. We'll check labs next week to make sure her potassium is  not dropping. 2. Hyperlipidemia:  Check labs. 3.  obesity: She has been successful at losing about 10 pounds. She has modified her diet. She also limit salt intake. 4.  leg swelling: hopefully, this will improve with diuretic.    Current medicines are reviewed at length with the patient today.  The patient concerns regarding her medicines were addressed.  The following changes have been made:   Add chlorthalidone  Labs/ tests ordered today include:  CMet and lipids to be checked No orders of the defined types were placed in this encounter.    Recommend 150 minutes/week of aerobic exercise Low fat, low carb, high fiber diet recommended  Disposition:   FU in  2 weeks with Pharm.D.   Delorise Jackson., MD  04/22/2015 12:28 PM    [facility] [address] [place], [place], [place]  [address] Phone: [phone]; Fax: [phone]

## 2015-04-22 ENCOUNTER — Ambulatory Visit (INDEPENDENT_AMBULATORY_CARE_PROVIDER_SITE_OTHER): Payer: BLUE CROSS/BLUE SHIELD | Admitting: Interventional Cardiology

## 2015-04-22 ENCOUNTER — Encounter: Payer: Self-pay | Admitting: Interventional Cardiology

## 2015-04-22 VITALS — BP 140/70 | HR 60 | Ht 60.0 in | Wt 156.4 lb

## 2015-04-22 DIAGNOSIS — E782 Mixed hyperlipidemia: Secondary | ICD-10-CM

## 2015-04-22 DIAGNOSIS — I10 Essential (primary) hypertension: Secondary | ICD-10-CM

## 2015-04-22 DIAGNOSIS — R600 Localized edema: Secondary | ICD-10-CM | POA: Diagnosis not present

## 2015-04-22 MED ORDER — CHLORTHALIDONE 25 MG PO TABS: 25.0000 mg | ORAL_TABLET | Freq: Every day | ORAL | Status: DC

## 2015-04-22 NOTE — Patient Instructions (Signed)
**Note De-Identified Jia Mohamed Obfuscation** Medication Instructions:  Start taking Chlorthalidone 25 mg daily-all other medications remain the same.  Labwork: Cmet and Lipids on Monday 04/25/15. Please do not eat or drink after midnight the night before.  Testing/Procedures: None  Follow-Up: Hypertension Clinic in 2 week. Dr Eldridge Dace in 6 months      If you need a refill on your cardiac medications before your next appointment, please call your pharmacy.

## 2015-04-25 ENCOUNTER — Other Ambulatory Visit (INDEPENDENT_AMBULATORY_CARE_PROVIDER_SITE_OTHER): Payer: BLUE CROSS/BLUE SHIELD | Admitting: *Deleted

## 2015-04-25 DIAGNOSIS — E782 Mixed hyperlipidemia: Secondary | ICD-10-CM | POA: Diagnosis not present

## 2015-04-25 LAB — COMPREHENSIVE METABOLIC PANEL
ALT: 17 U/L (ref 6–29)
AST: 14 U/L (ref 10–35)
Albumin: 3.5 g/dL — ABNORMAL LOW (ref 3.6–5.1)
Alkaline Phosphatase: 60 U/L (ref 33–130)
BUN: 11 mg/dL (ref 7–25)
CALCIUM: 9.3 mg/dL (ref 8.6–10.4)
CHLORIDE: 105 mmol/L (ref 98–110)
CO2: 26 mmol/L (ref 20–31)
CREATININE: 0.87 mg/dL (ref 0.50–1.05)
Glucose, Bld: 121 mg/dL — ABNORMAL HIGH (ref 65–99)
Potassium: 4.1 mmol/L (ref 3.5–5.3)
SODIUM: 140 mmol/L (ref 135–146)
Total Bilirubin: 0.4 mg/dL (ref 0.2–1.2)
Total Protein: 6.5 g/dL (ref 6.1–8.1)

## 2015-04-25 LAB — LIPID PANEL
CHOL/HDL RATIO: 2.3 ratio (ref <=5.0)
Cholesterol: 154 mg/dL (ref 125–200)
HDL: 68 mg/dL (ref >=46)
LDL Cholesterol: 71 mg/dL (ref <130)
Triglycerides: 73 mg/dL (ref <150)
VLDL: 15 mg/dL (ref <30)

## 2015-05-05 ENCOUNTER — Ambulatory Visit: Payer: BLUE CROSS/BLUE SHIELD | Admitting: Pharmacist

## 2015-08-30 ENCOUNTER — Other Ambulatory Visit: Payer: Self-pay | Admitting: Interventional Cardiology

## 2015-11-11 ENCOUNTER — Other Ambulatory Visit: Payer: Self-pay | Admitting: Interventional Cardiology

## 2016-03-03 ENCOUNTER — Other Ambulatory Visit: Payer: Self-pay | Admitting: Interventional Cardiology

## 2016-03-30 ENCOUNTER — Other Ambulatory Visit: Payer: Self-pay | Admitting: Interventional Cardiology

## 2016-06-15 ENCOUNTER — Encounter: Payer: Self-pay | Admitting: Interventional Cardiology

## 2016-07-03 ENCOUNTER — Encounter: Payer: Self-pay | Admitting: Interventional Cardiology

## 2016-07-03 ENCOUNTER — Ambulatory Visit (INDEPENDENT_AMBULATORY_CARE_PROVIDER_SITE_OTHER): Payer: BLUE CROSS/BLUE SHIELD | Admitting: Interventional Cardiology

## 2016-07-03 VITALS — BP 116/70 | HR 66 | Ht 60.0 in | Wt 159.4 lb

## 2016-07-03 DIAGNOSIS — R600 Localized edema: Secondary | ICD-10-CM | POA: Diagnosis not present

## 2016-07-03 DIAGNOSIS — E782 Mixed hyperlipidemia: Secondary | ICD-10-CM | POA: Diagnosis not present

## 2016-07-03 DIAGNOSIS — I10 Essential (primary) hypertension: Secondary | ICD-10-CM

## 2016-07-03 DIAGNOSIS — M7989 Other specified soft tissue disorders: Secondary | ICD-10-CM | POA: Insufficient documentation

## 2016-07-03 MED ORDER — ATORVASTATIN CALCIUM 10 MG PO TABS: 10.0000 mg | ORAL_TABLET | Freq: Every day | ORAL | 3 refills | Status: DC

## 2016-07-03 NOTE — Patient Instructions (Addendum)
Medication Instructions:  Your physician has recommended you make the following change in your medication:  1. Start Atorvastatin 10 mg daily.    Labwork: None ordered.  Testing/Procedures: None ordered.   Follow-Up: Your physician wants you to follow-up in: 1 year with Dr. Eldridge Dace. You will receive a reminder letter in the mail two months in advance. If you don't receive a letter, please call our office to schedule the follow-up appointment.   Any Other Special Instructions Will Be Listed Below (If Applicable).     If you need a refill on your cardiac medications before your next appointment, please call your pharmacy.

## 2016-07-03 NOTE — Progress Notes (Signed)
Patient ID: Elizabeth [name], female   DOB: 10-16-1955, 61 y.o.   MRN: [id number]     Cardiology Office Note   Date:  07/03/2016   ID:  Elizabeth [name], DOB March 17, 1955, MRN [id number]  PCP:  Elizabeth Fick, MD    No chief complaint on file. f/u HTN   Wt Readings from Last 3 Encounters:  07/03/16 159 lb 6.4 oz (72.3 kg)  04/22/15 156 lb 6.4 oz (70.9 kg)  08/10/14 166 lb (75.3 kg)       History of Present Illness: Elizabeth [name] is a 61 y.o. female  who had HTN. She describes herself as anxious and this makes the BP increase as well. She lived in [place] before coming to [place]. BP at home in [place] was 130/80. Once a month, she may have a spike as described. She reports headache with the high readings.   She had a kidney stone and had lithotripsy in [place]. She uses some cystone for her pain. No CP, SHOB, edema. Her only sx are when the BP is up. She has had difficulty losing weight.   Her cholesterol has been high in the past. It has not been checked recently.   She has been seen here several times.  SHe was followed byt the PharmDs as well for her HTN.   Her blood pressure has been much better of late. Typically, at home, it is in the 130s to 140 systolic range. On occasion, she'll have a reading in the 150 range.  She is taking a diuretic for leg swelling.  SHe was started on PPI for GERD sx.   Past Medical History:  Diagnosis Date  . Hypertension     Past Surgical History:  Procedure Laterality Date  . CESAREAN SECTION       Current Outpatient Prescriptions  Medication Sig Dispense Refill  . amLODipine (NORVASC) 10 MG tablet Take 1 tablet (10 mg total) by mouth daily. Or as directed 30 tablet 6  . carvedilol (COREG) 6.25 MG tablet Take 1 tablet (6.25 mg total) by mouth 2 (two) times daily with a meal. *Please call and schedule an appointment* 60 tablet 0  . chlorthalidone (HYGROTON) 25 MG tablet take 1 tablet by mouth once daily 90 tablet 0  .  fluticasone (FLONASE) 50 MCG/ACT nasal spray Place 2 sprays into both nostrils daily.   0  . furosemide (LASIX) 20 MG tablet Take 20 mg by mouth daily.  3  . guanFACINE (TENEX) 1 MG tablet Take 1 mg by mouth at bedtime. Reported on 04/22/2015  0  . losartan (COZAAR) 100 MG tablet Take 100 mg by mouth daily.    . pantoprazole (PROTONIX) 40 MG tablet Take 40 mg by mouth daily.  1  . penicillin v potassium (VEETID) 500 MG tablet Take by mouth every 6 (six) hours. DENTAL  0   No current facility-administered medications for this visit.     Allergies:   Patient has no known allergies.    Social History:  The patient  reports that she has never smoked. She has never used smokeless tobacco. She reports that she does not drink alcohol or use drugs.   Family History:  The patient's family history includes Hypertension in her mother.    ROS:  Please see the history of present illness.   Otherwise, review of systems are positive for leg swelling- improving .   All other systems are reviewed and negative.    PHYSICAL EXAM: VS:  BP 116/70 (BP Location: Left  Arm, Patient Position: Sitting, Cuff Size: Normal)   Pulse 66   Ht 5' (1.524 m)   Wt 159 lb 6.4 oz (72.3 kg)   SpO2 98%   BMI 31.13 kg/m  , BMI Body mass index is 31.13 kg/m. GEN: Well nourished, well developed, in no acute distress  HEENT: normal  Neck: no JVD, carotid bruits, or masses Cardiac: RRR; no murmurs, rubs, or gallops,no edema  Respiratory:  clear to auscultation bilaterally, normal work of breathing GI: soft, nontender, nondistended, + BS MS: no deformity or atrophy  Skin: warm and dry, no rash Neuro:  Strength and sensation are intact Psych: euthymic mood, full affect   Recent Labs: No results found for requested labs within last 8760 hours.   Lipid Panel    Component Value Date/Time   CHOL 154 04/25/2015 0744   TRIG 73 04/25/2015 0744   HDL 68 04/25/2015 0744   CHOLHDL 2.3 04/25/2015 0744   VLDL 15 04/25/2015  0744   LDLCALC 71 04/25/2015 0744     Other studies Reviewed: Additional studies/ records that were reviewed today with results demonstrating:  Prior high cholesterol.   ASSESSMENT AND PLAN:  1. HTN:  Well controlled.  COntinue current meds and regular exercise. 2. Hyperlipidemia:  Checked by Dr. Nicholos [name].  Managed with atorvastatin that she had from [place]. Unsure of dose, likely 10 mg dialy. 3.  obesity: She has been successful at maintining about 10 pound weight loss for the past 2 years. She has modified her diet. She also limits salt intake.  She uses the treadmill regularly 4.  leg swelling:  improved with diuretic.    Current medicines are reviewed at length with the patient today.  The patient concerns regarding her medicines were addressed.  The following changes have been made:   Add chlorthalidone  Labs/ tests ordered today include:  CMet and lipids to be checked No orders of the defined types were placed in this encounter.   Recommend 150 minutes/week of aerobic exercise Low fat, low carb, high fiber diet recommended  Disposition:   FU in  2 weeks with Pharm.D.   Signed, Lance Muss, MD  07/03/2016 3:25 PM    [facility] [address] [place], [place], [place]  [address] Phone: [phone]; Fax: [phone]

## 2016-10-02 ENCOUNTER — Other Ambulatory Visit: Payer: Self-pay | Admitting: Interventional Cardiology

## 2017-05-14 ENCOUNTER — Other Ambulatory Visit: Payer: Self-pay | Admitting: Interventional Cardiology

## 2017-06-05 NOTE — Progress Notes (Signed)
Cardiology Office Note   Date:  06/06/2017   ID:  Elizabeth [name], DOB 11/15/1955, MRN [id number]  PCP:  Georgianne Fickamachandran, Ajith, MD    No chief complaint on [name].  Hypertension  Wt Readings from Last 3 Encounters:  06/06/17 167 lb (75.8 kg)  07/03/16 159 lb 6.4 oz (72.3 kg)  04/22/15 156 lb 6.4 oz (70.9 kg)       History of Present Illness: Elizabeth [name] is a 62 y.o. female  who had HTN. She describes herself as anxious and this makes the BP increase as well. She lived in [place] before coming to [place]. BP at home in [place] was 130/80. Once a month, she may have a spike as described. She reports headache with the high readings.   She had a kidney stone and [place] in [place]. She uses some cystone for her pain.  Since the last visit, she has done well.  She has some elevated blood pressure readings in the evening.  She also has had difficulty losing weight.  She has occasional swelling in her legs.  She has not been taking any diuretic.  She had labs checked with her primary care doctor.  Denies : Chest pain. Dizziness. Nitroglycerin use. Orthopnea. Palpitations. Paroxysmal nocturnal dyspnea. Shortness of breath. Syncope.    Past Medical History:  Diagnosis Date  . Hypertension     Past Surgical History:  Procedure Laterality Date  . CESAREAN SECTION       Current Outpatient Medications  Medication Sig Dispense Refill  . amLODipine (NORVASC) 5 MG tablet Take 1 tablet (5 mg total) by mouth 2 (two) times daily. Or as directed 180 tablet 3  . atorvastatin (LIPITOR) 10 MG tablet TAKE 1 TABLET BY MOUTH ONCE DAILY 90 tablet 0  . carvedilol (COREG) 6.25 MG tablet Take 1 tablet (6.25 mg total) by mouth 2 (two) times daily with a meal. 60 tablet 8  . chlorthalidone (HYGROTON) 25 MG tablet take 1 tablet by mouth once daily 90 tablet 0  . guanFACINE (TENEX) 1 MG tablet Take 1 mg by mouth at bedtime. Reported on 04/22/2015  0  . losartan (COZAAR) 100 MG tablet Take  100 mg by mouth daily.    . metoprolol tartrate (LOPRESSOR) 50 MG tablet Take 50 mg by mouth as needed. Take 50 mg by mouth once as needed if blood pressure 150 to 180  6  . pantoprazole (PROTONIX) 40 MG tablet Take 40 mg by mouth daily.  1  . furosemide (LASIX) 20 MG tablet Take 1 tablet (20 mg total) by mouth daily as needed (swelling). 30 tablet 5   No current facility-administered medications for this visit.     Allergies:   Patient has no known allergies.    Social History:  The patient  reports that she has never smoked. She has never used smokeless tobacco. She reports that she does not drink alcohol or use drugs.   Family History:  The patient's family history includes Hypertension in her mother.    ROS:  Please see the history of present illness.   Otherwise, review of systems are positive for ankle swelling.   All other systems are reviewed and negative.    PHYSICAL EXAM: VS:  BP 130/76   Pulse 62   Ht 5' (1.524 m)   Wt 167 lb (75.8 kg)   SpO2 98%   BMI 32.61 kg/m  , BMI Body mass index is 32.61 kg/m. GEN: Well nourished, well developed, in no  acute distress  HEENT: normal  Neck: no JVD, carotid bruits, or masses Cardiac: RRR; no murmurs, rubs, or gallops,; minimal ankle edema  Respiratory:  clear to auscultation bilaterally, normal work of breathing GI: soft, nontender, nondistended, + BS MS: no deformity or atrophy  Skin: warm and dry, no rash Neuro:  Strength and sensation are intact Psych: euthymic mood, full affect   EKG:   The ekg ordered today demonstrates normal sinus rhythm, no ST segment changes   Recent Labs: No results found for requested labs within last 8760 hours.   Lipid Panel    Component Value Date/Time   CHOL 154 04/25/2015 0744   TRIG 73 04/25/2015 0744   HDL 68 04/25/2015 0744   CHOLHDL 2.3 04/25/2015 0744   VLDL 15 04/25/2015 0744   LDLCALC 71 04/25/2015 0744     Other studies Reviewed: Additional studies/ records that were  reviewed today with results demonstrating: Lab work from her primary care doctor reviewed.   ASSESSMENT AND PLAN:  1. HTN: Okay to change amlodipine to 5 mg twice daily.  This may help lower blood pressure readings in the evening. 2. Hyperlipidemia: LDL 74 in April 2018.  Continue atorvastatin. 3. Obesity: Continue healthy diet and exercise and attempt to lose weight. 4. Lower extremity edema: I encouraged her to elevate her legs.  We will also give furosemide 20 mg daily as needed.  If she uses the furosemide, she will eat potassium rich foods that particular day.  She had this diuretic in the past but the prescription ran out.  I encouraged her to try to use this no more than twice a week.   Current medicines are reviewed at length with the patient today.  The patient concerns regarding her medicines were addressed.  The following changes have been made:  No change  Labs/ tests ordered today include:   Orders Placed This Encounter  Procedures  . EKG 12-Lead    Recommend 150 minutes/week of aerobic exercise Low fat, low carb, high fiber diet recommended  Disposition:   FU in 1 year   Signed, Lance Muss, MD  06/06/2017 11:52 AM    [facility] [address] [place], [place], [place]  [address] Phone: [phone]; Fax: [phone]

## 2017-06-06 ENCOUNTER — Ambulatory Visit: Payer: BLUE CROSS/BLUE SHIELD | Admitting: Interventional Cardiology

## 2017-06-06 ENCOUNTER — Encounter: Payer: Self-pay | Admitting: Interventional Cardiology

## 2017-06-06 VITALS — BP 130/76 | HR 62 | Ht 60.0 in | Wt 167.0 lb

## 2017-06-06 DIAGNOSIS — M25471 Effusion, right ankle: Secondary | ICD-10-CM

## 2017-06-06 DIAGNOSIS — I10 Essential (primary) hypertension: Secondary | ICD-10-CM | POA: Diagnosis not present

## 2017-06-06 DIAGNOSIS — E782 Mixed hyperlipidemia: Secondary | ICD-10-CM | POA: Diagnosis not present

## 2017-06-06 DIAGNOSIS — E669 Obesity, unspecified: Secondary | ICD-10-CM | POA: Diagnosis not present

## 2017-06-06 DIAGNOSIS — M25472 Effusion, left ankle: Secondary | ICD-10-CM | POA: Diagnosis not present

## 2017-06-06 MED ORDER — FUROSEMIDE 20 MG PO TABS: 20.0000 mg | ORAL_TABLET | Freq: Every day | ORAL | 5 refills | Status: DC | PRN

## 2017-06-06 MED ORDER — AMLODIPINE BESYLATE 5 MG PO TABS: 5.0000 mg | ORAL_TABLET | Freq: Two times a day (BID) | ORAL | 3 refills | Status: DC

## 2017-06-06 NOTE — Patient Instructions (Signed)
Medication Instructions:  Your physician has recommended you make the following change in your medication:   1. TAKE: amlodipine (norvasc) 5 mg twice a day  2. TAKE: furosemide (lasix) 20 mg daily AS NEEDED for swelling  3. If you take lasix, you will need to eat something high in potassium with it  Labwork: None ordered  Testing/Procedures: None ordered  Follow-Up: Your physician wants you to follow-up in: 1 year with Dr. Eldridge DaceVaranasi. You will receive a reminder letter in the mail two months in advance. If you don't receive a letter, please call our office to schedule the follow-up appointment.   Any Other Special Instructions Will Be Listed Below (If Applicable).     If you need a refill on your cardiac medications before your next appointment, please call your pharmacy.

## 2017-09-06 ENCOUNTER — Other Ambulatory Visit: Payer: Self-pay | Admitting: Interventional Cardiology

## 2018-01-30 ENCOUNTER — Encounter: Payer: Self-pay | Admitting: Cardiology

## 2018-01-30 ENCOUNTER — Ambulatory Visit: Payer: BLUE CROSS/BLUE SHIELD | Admitting: Family Medicine

## 2018-01-30 VITALS — BP 118/72 | HR 62 | Temp 98.0°F | Ht 60.0 in | Wt 168.5 lb

## 2018-01-30 DIAGNOSIS — R600 Localized edema: Secondary | ICD-10-CM | POA: Diagnosis not present

## 2018-01-30 DIAGNOSIS — K5909 Other constipation: Secondary | ICD-10-CM

## 2018-01-30 DIAGNOSIS — R7303 Prediabetes: Secondary | ICD-10-CM | POA: Diagnosis not present

## 2018-01-30 DIAGNOSIS — Z1211 Encounter for screening for malignant neoplasm of colon: Secondary | ICD-10-CM

## 2018-01-30 DIAGNOSIS — M722 Plantar fascial fibromatosis: Secondary | ICD-10-CM

## 2018-01-30 DIAGNOSIS — K219 Gastro-esophageal reflux disease without esophagitis: Secondary | ICD-10-CM | POA: Insufficient documentation

## 2018-01-30 MED ORDER — RABEPRAZOLE SODIUM 20 MG PO TBEC: 20.0000 mg | DELAYED_RELEASE_TABLET | Freq: Two times a day (BID) | ORAL | 2 refills | Status: DC

## 2018-01-30 NOTE — Patient Instructions (Addendum)
Stay well hydrated. Consider Metamucil and eat lots of veggies/fruits.  Try MiraLAX 1-2 times daily over the next 3-4 days. If no improvement, try using an enema. Stay well hydrated and keep lots of fiber in your diet.  Give [place] 2-3 business days to get the results of your labs back.   Stay active, elevated legs and mind salt intake. Wear compression stockings while standing/sitting for long periods of time.  Stop the Protonix. Take the Aciphex twice daily from now on. The only lifestyle changes that have data behind them are weight loss for the overweight/obese and elevating the head of the bed. Finding out which foods/positions are triggers is important.  If you do not hear anything about your referral in the next 1-2 weeks, call our office and ask for an update.  Ice/cold pack over area for 10-15 min twice daily.   Plantar Fasciitis Stretches/exercises Do exercises exactly as told by your health care provider and adjust them as directed. It is normal to feel mild stretching, pulling, tightness, or discomfort as you do these exercises, but you should stop right away if you feel sudden pain or your pain gets worse.   Stretching and range of motion exercises These exercises warm up your muscles and joints and improve the movement and flexibility of your foot. These exercises also help to relieve pain.  Exercise A: Plantar fascia stretch 1. Sit with your left / right leg crossed over your opposite knee. 2. Hold your heel with one hand with that thumb near your arch. With your other hand, hold your toes and gently pull them back toward the top of your foot. You should feel a stretch on the bottom of your toes or your foot or both. 3. Hold this stretch for 30 seconds. 4. Slowly release your toes and return to the starting position. Repeat 2 times. Complete this exercise 3 times per week.  Exercise B: Gastroc, standing 1. Stand with your hands against a wall. 2. Extend your left / right leg  behind you, and bend your front knee slightly. 3. Keeping your heels on the floor and keeping your back knee straight, shift your weight toward the wall without arching your back. You should feel a gentle stretch in your left / right calf. 4. Hold this position for 30 seconds. Repeat 2 times. Complete this exercise 3 times a week. Exercise C: Soleus, standing 1. Stand with your hands against a wall. 2. Extend your left / right leg behind you, and bend your front knee slightly. 3. Keeping your heels on the floor, bend your back knee and slightly shift your weight over the back leg. You should feel a gentle stretch deep in your calf. 4. Hold this position for 30 seconds. Repeat 2 times. Complete this exercise 3 times per week. Exercise D: Gastrocsoleus, standing 1. Stand with the ball of your left / right foot on a step. The ball of your foot is on the walking surface, right under your toes. 2. Keep your other foot firmly on the same step. 3. Hold onto the wall or a railing for balance. 4. Slowly lift your other foot, allowing your body weight to press your heel down over the edge of the step. You should feel a stretch in your left / right calf. 5. Hold this position for 30 seconds. 6. Return both feet to the step. 7. Repeat this exercise with a slight bend in your left / right knee. Repeat 2 times with your left /  right knee straight and 2times with your left / right knee bent. Complete this exercise 3 times a week.  Balance exercise This exercise builds your balance and strength control of your arch to help take pressure off your plantar fascia. Exercise E: Single leg stand 1. Without shoes, stand near a railing or in a doorway. You may hold onto the railing or door frame as needed. 2. Stand on your left / right foot. Keep your big toe down on the floor and try to keep your arch lifted. Do not let your foot roll inward. 3. Hold this position for 30 seconds. 4. If this exercise is too easy,  you can try it with your eyes closed or while standing on a pillow. Repeat 2 times. Complete this exercise 3 times per week. This information is not intended to replace advice given to you by your health care provider. Make sure you discuss any questions you have with your health care provider. Document Released: 02/26/2005 Document Revised: 11/01/2015 Document Reviewed: 01/10/2015 Elsevier Interactive Patient Education  2017 [employer]  Let us know if you need anything.

## 2018-01-30 NOTE — Progress Notes (Signed)
Chief Complaint  Patient presents with  . New Patient (Initial Visit)       New Patient Visit SUBJECTIVE: HPI: Elizabeth [name] is an 62 y.o.female who is being seen for establishing care.  The patient was previously seen at [facility].  Hx of chronic constipation. This has been going on for many years. Has tried teas, fiber supplements, increasing fluids, metamucil, MiraLax. Last BM was 3 d ago. Will usually go every 3-4 d.   Swelling in bl LE's for 5-6 mo. No CP or sob. Goes up to mid of legs. Lasix has been mildly helpful in past.  No history of heart, kidney, or hepatic failure.  She does not use compression stockings.  She is does not consume large quantities of salt.  She has a history of reflux.  She is currently on AcipHex in the morning and Protonix at night.  The Protonix is not helpful, she will reflux at night.  It is unclear why she is on both of these PPIs.  She does elevate the head of her bed.  She avoids triggers.  Left heel pain for several months.  No injury or change in activity.  Worse when she sits for long periods of time and then starts walking.  Has not tried anything at home for this.  She has never had a colonoscopy.  She was diagnosed with prediabetes in the past.  No medication prescribed.  She is interested in seeing what it is today.  No Known Allergies  Past Medical History:  Diagnosis Date  . Hypertension    Past Surgical History:  Procedure Laterality Date  . CESAREAN SECTION     Family History  Problem Relation Age of Onset  . Hypertension Mother   . Heart attack Neg Hx   . Stroke Neg Hx    No Known Allergies  Current Outpatient Medications:  .  amLODipine (NORVASC) 5 MG tablet, Take 1 tablet (5 mg total) by mouth 2 (two) times daily. Or as directed, Disp: 180 tablet, Rfl: 3 .  atorvastatin (LIPITOR) 10 MG tablet, TAKE 1 TABLET BY MOUTH ONCE DAILY, Disp: 90 tablet, Rfl: 2 .  carvedilol (COREG) 6.25 MG tablet, Take 1 tablet (6.25 mg total) by  mouth 2 (two) times daily with a meal., Disp: 60 tablet, Rfl: 8 .  chlorthalidone (HYGROTON) 25 MG tablet, take 1 tablet by mouth once daily, Disp: 90 tablet, Rfl: 0 .  guanFACINE (TENEX) 1 MG tablet, Take 1 mg by mouth at bedtime. Reported on 04/22/2015, Disp: , Rfl: 0 .  losartan (COZAAR) 100 MG tablet, Take 100 mg by mouth daily., Disp: , Rfl:  .  metoprolol tartrate (LOPRESSOR) 50 MG tablet, Take 50 mg by mouth as needed. Take 50 mg by mouth once as needed if blood pressure 150 to 180, Disp: , Rfl: 6 .  RABEprazole (ACIPHEX) 20 MG tablet, Take 1 tablet (20 mg total) by mouth 2 (two) times daily., Disp: 60 tablet, Rfl: 2 .  furosemide (LASIX) 20 MG tablet, Take 1 tablet (20 mg total) by mouth daily as needed (swelling)., Disp: 30 tablet, Rfl: 5  ROS Cardiovascular: Denies chest pain  Respiratory: Denies dyspnea  GI: +reflux Const: no fevers Skin: No rash MSK: +heel pain Neuro: No weakness HEENT: No vision changes, no ST GU: No urinary complaints Endo: No wt loss  OBJECTIVE: BP 118/72 (BP Location: Left Arm, Patient Position: Sitting, Cuff Size: Normal)   Pulse 62   Temp 98 F (36.7 C) (Oral)  Ht 5' (1.524 m)   Wt 168 lb 8 oz (76.4 kg)   SpO2 98%   BMI 32.91 kg/m   Constitutional: -  VS reviewed -  Well developed, well nourished, appears stated age -  No apparent distress  Psychiatric: -  Oriented to person, place, and time -  Memory intact -  Affect and mood normal -  Fluent conversation, good eye contact -  Judgment and insight age appropriate  Eye: -  Conjunctivae clear, no discharge -  Pupils symmetric, round, reactive to light  ENMT: -  MMM    Pharynx moist, no exudate, no erythema  Neck: -  No gross swelling, no palpable masses -  Thyroid midline, not enlarged, mobile, no palpable masses  Cardiovascular: -  RRR -  2+ pitting bilateral LE edema up to distal third of tibia  Respiratory: -  Normal respiratory effort, no accessory muscle use, no retraction -   Breath sounds equal, no wheezes, no ronchi, no crackles  Gastrointestinal: -  Bowel sounds normal -  No tenderness, no distention, no guarding, no masses  Musculoskeletal: -  No clubbing, no cyanosis -  Gait normal -  +ttp over the medial plantar surface of the left foot near the insertion of plantar fascia  Skin: -  No significant lesion on inspection -  Warm and dry to palpation   ASSESSMENT/PLAN: Chronic constipation  Gastroesophageal reflux disease, esophagitis presence not specified  Pedal edema - Plan: TSH, Comprehensive metabolic panel  Screen for colon cancer - Plan: Ambulatory referral to Gastroenterology  Plantar fasciitis, left  Prediabetes - Plan: Hemoglobin A1c  #1-Stay hydrated, consider fiber supplementation, MiraLAX 1-2 times daily as needed, enema if no improvement after 3 days. #2-stop Protonix, increase AcipHex to twice daily.  Reflux precautions discussed and written down.  Weight loss. #3-compression stocking prescription provided, elevate legs, stay active, mind salt intake #4-refer to gastroenterology team for colonoscopy #5-stretches/exercises provided, ice, Tylenol #6-check A1c, counseled on diet and exercise Patient should return in 2 mo. The patient voiced understanding and agreement to the plan.   Jilda Rocheicholas Paul RedfieldWendling, DO 01/30/18  4:31 PM

## 2018-01-30 NOTE — Progress Notes (Signed)
Pre visit review using our clinic review tool, if applicable. No additional management support is needed unless otherwise documented below in the visit note. 

## 2018-01-31 LAB — TSH: TSH: 1.66 u[IU]/mL (ref 0.35–4.50)

## 2018-01-31 LAB — COMPREHENSIVE METABOLIC PANEL
ALT: 14 U/L (ref 0–35)
AST: 13 U/L (ref 0–37)
Albumin: 4.3 g/dL (ref 3.5–5.2)
Alkaline Phosphatase: 76 U/L (ref 39–117)
BILIRUBIN TOTAL: 0.5 mg/dL (ref 0.2–1.2)
BUN: 11 mg/dL (ref 6–23)
CO2: 30 meq/L (ref 19–32)
Calcium: 9.6 mg/dL (ref 8.4–10.5)
Chloride: 106 mEq/L (ref 96–112)
Creatinine, Ser: 0.86 mg/dL (ref 0.40–1.20)
GFR: 71.13 mL/min (ref >60.00)
Glucose, Bld: 126 mg/dL — ABNORMAL HIGH (ref 70–99)
Potassium: 4.7 mEq/L (ref 3.5–5.1)
SODIUM: 143 meq/L (ref 135–145)
Total Protein: 7.2 g/dL (ref 6.0–8.3)

## 2018-01-31 LAB — HEMOGLOBIN A1C: HEMOGLOBIN A1C: 6.7 % — AB (ref 4.6–6.5)

## 2018-02-18 ENCOUNTER — Ambulatory Visit: Payer: BLUE CROSS/BLUE SHIELD | Admitting: Cardiology

## 2018-02-19 ENCOUNTER — Encounter: Payer: Self-pay | Admitting: Gastroenterology

## 2018-02-20 ENCOUNTER — Encounter: Payer: Self-pay | Admitting: Family Medicine

## 2018-02-20 ENCOUNTER — Ambulatory Visit: Payer: BLUE CROSS/BLUE SHIELD | Admitting: Family Medicine

## 2018-02-20 VITALS — BP 132/70 | HR 67 | Temp 98.5°F | Ht 60.0 in | Wt 169.4 lb

## 2018-02-20 DIAGNOSIS — E669 Obesity, unspecified: Secondary | ICD-10-CM

## 2018-02-20 DIAGNOSIS — Z23 Encounter for immunization: Secondary | ICD-10-CM

## 2018-02-20 DIAGNOSIS — E1165 Type 2 diabetes mellitus with hyperglycemia: Secondary | ICD-10-CM | POA: Insufficient documentation

## 2018-02-20 DIAGNOSIS — E1169 Type 2 diabetes mellitus with other specified complication: Secondary | ICD-10-CM | POA: Diagnosis not present

## 2018-02-20 MED ORDER — ATORVASTATIN CALCIUM 40 MG PO TABS: 40.0000 mg | ORAL_TABLET | Freq: Every day | ORAL | 3 refills | Status: DC

## 2018-02-20 MED ORDER — GLUCOSE BLOOD VI STRP: ORAL_STRIP | 12 refills | Status: AC

## 2018-02-20 NOTE — Patient Instructions (Addendum)
Keep the diet clean and stay active.  If you do not hear anything about your referral in the next 1-2 weeks, call our office and ask for an update.  Aim to do some physical exertion for 150 minutes per week. This is typically divided into 5 days per week, 30 minutes per day. The activity should be enough to get your heart rate up. Anything is better than nothing if you have time constraints.  Healthy Eating Plan Many factors influence your heart health, including eating and exercise habits. Heart (coronary) risk increases with abnormal blood fat (lipid) levels. Heart-healthy meal planning includes limiting unhealthy fats, increasing healthy fats, and making other small dietary changes. This includes maintaining a healthy body weight to help keep lipid levels within a normal range.  WHAT IS MY PLAN?  Your health care provider recommends that you:  Drink a glass of water before meals to help with satiety.  Eat slowly.  An alternative to the water is to add Metamucil. This will help with satiety as well. It does contain calories, unlike water.  WHAT TYPES OF FAT SHOULD I CHOOSE?  Choose healthy fats more often. Choose monounsaturated and polyunsaturated fats, such as olive oil and canola oil, flaxseeds, walnuts, almonds, and seeds.  Eat more omega-3 fats. Good choices include salmon, mackerel, sardines, tuna, flaxseed oil, and ground flaxseeds. Aim to eat fish at least two times each week.  Avoid foods with partially hydrogenated oils in them. These contain trans fats. Examples of foods that contain trans fats are stick margarine, some tub margarines, cookies, crackers, and other baked goods. If you are going to avoid a fat, this is the one to avoid!  WHAT GENERAL GUIDELINES DO I NEED TO FOLLOW?  Check food labels carefully to identify foods with trans fats. Avoid these types of options when possible.  Fill one half of your plate with vegetables and green salads. Eat 4-5 servings of  vegetables per day. A serving of vegetables equals 1 cup of raw leafy vegetables,  cup of raw or cooked cut-up vegetables, or  cup of vegetable juice.  Fill one fourth of your plate with whole grains. Look for the word "whole" as the first word in the ingredient list.  Fill one fourth of your plate with lean protein foods.  Eat 4-5 servings of fruit per day. A serving of fruit equals one medium whole fruit,  cup of dried fruit,  cup of fresh, frozen, or canned fruit. Try to avoid fruits in cups/syrups as the sugar content can be high.  Eat more foods that contain soluble fiber. Examples of foods that contain this type of fiber are apples, broccoli, carrots, beans, peas, and barley. Aim to get 20-30 g of fiber per day.  Eat more home-cooked food and less restaurant, buffet, and fast food.  Limit or avoid alcohol.  Limit foods that are high in starch and sugar.  Avoid fried foods when able.  Cook foods by using methods other than frying. Baking, boiling, grilling, and broiling are all great options. Other fat-reducing suggestions include: ? Removing the skin from poultry. ? Removing all visible fats from meats. ? Skimming the fat off of stews, soups, and gravies before serving them. ? Steaming vegetables in water or broth.  Lose weight if you are overweight. Losing just 5-10% of your initial body weight can help your overall health and prevent diseases such as diabetes and heart disease.  Increase your consumption of nuts, legumes, and seeds to 4-5 servings  per week. One serving of dried beans or legumes equals  cup after being cooked, one serving of nuts equals 1 ounces, and one serving of seeds equals  ounce or 1 tablespoon.  WHAT ARE GOOD FOODS CAN I EAT? Grains Grainy breads (try to find bread that is 3 g of fiber per slice or greater), oatmeal, light popcorn. Whole-grain cereals. Rice and pasta, including brown rice and those that are made with whole wheat. Edamame pasta is a  great alternative to grain pasta. It has a higher protein content. Try to avoid significant consumption of white bread, sugary cereals, or pastries/baked goods.  Vegetables All vegetables. Cooked white potatoes do not count as vegetables.  Fruits All fruits, but limit pineapple and bananas as these fruits have a higher sugar content.  Meats and Other Protein Sources Lean, well-trimmed beef, veal, pork, and lamb. Chicken and [place] without skin. All fish and shellfish. Wild duck, rabbit, pheasant, and venison. Egg whites or low-cholesterol egg substitutes. Dried beans, peas, lentils, and tofu.Seeds and most nuts.  Dairy Low-fat or nonfat cheeses, including ricotta, string, and mozzarella. Skim or 1% milk that is liquid, powdered, or evaporated. Buttermilk that is made with low-fat milk. Nonfat or low-fat yogurt. Soy/Almond milk are good alternatives if you cannot handle dairy.  Beverages Water is the best for you. Sports drinks with less sugar are more desirable unless you are a highly active athlete.  Sweets and Desserts Sherbets and fruit ices. Honey, jam, marmalade, jelly, and syrups. Dark chocolate.  Eat all sweets and desserts in moderation.  Fats and Oils Nonhydrogenated (trans-free) margarines. Vegetable oils, including soybean, sesame, sunflower, olive, peanut, safflower, corn, canola, and cottonseed. Salad dressings or mayonnaise that are made with a vegetable oil. Limit added fats and oils that you use for cooking, baking, salads, and as spreads.  Other Cocoa powder. Coffee and tea. Most condiments.  The items listed above may not be a complete list of recommended foods or beverages. Contact your dietitian for more options.  Sleep Hygiene Tips:  Do not watch TV or look at screens within 1 hour of going to bed. If you do, make sure there is a blue light filter (nighttime mode) involved.  Try to go to bed around the same time every night. Wake up at the same time within 1  hour of regular time. Ex: If you wake up at 7 AM for work, do not sleep past 8 AM on days that you don't work.  Do not drink alcohol before bedtime.  Do not consume caffeine-containing beverages after noon or within 9 hours of intended bedtime.  Get regular exercise/physical activity in your life, but not within 2 hours of planned bedtime.  Do not take naps.   Do not eat within 2 hours of planned bedtime.  Melatonin, 3-5 mg 30-60 minutes before planned bedtime may be helpful.   The bed should be for sleep or sex only. If after 20-30 minutes you are unable to fall asleep, get up and do something relaxing. Do this until you feel ready to go to sleep again.   Let us know if you need anything.

## 2018-02-20 NOTE — Progress Notes (Signed)
Chief Complaint  Patient presents with  . Results    discuss    Subjective: Patient is a 62 y.o. female here for lab results.  A1c of 6.7. No hx of DM. Diet is fair, doesn't eat much. Will do some walking.  Does not follow with an ophthalmologist.  She does have a glucometer at home.   ROS: Heart: Denies chest pain  Lungs: Denies SOB   Past Medical History:  Diagnosis Date  . Hypertension     Objective: BP 132/70 (BP Location: Left Arm, Patient Position: Sitting, Cuff Size: Normal)   Pulse 67   Temp 98.5 F (36.9 C) (Oral)   Ht 5' (1.524 m)   Wt 169 lb 6 oz (76.8 kg)   SpO2 97%   BMI 33.08 kg/m  General: Awake, appears stated age Skin: No lesions on feet Neuro: Sensation intact to pinprick b/l Heart: RRR Lungs: CTAB, no rales, wheezes or rhonchi. No accessory muscle use Psych: Age appropriate judgment and insight, normal affect and mood  Assessment and Plan: Diabetes mellitus type 2 in obese (HCC) - Plan: HM DIABETES FOOT EXAM, Ambulatory referral to Ophthalmology, Microalbumin / creatinine urine ratio, Uric acid, Lipid panel  Orders as above. Counseled on diet and exercise. She does have a glucometer at home, will send in refills for strips. Set up with ophthalmologist. Update flu shot, tetanus shot, and pneumonia vaccine. F/u in 5 mo. The patient and her husband voiced understanding and agreement to the plan.  Greater than 15 minutes were spent face to face with the patient with greater than 50% of this time spent counseling on treatment, prognosis, complications, maintenance and follow up.    Jilda Rocheicholas Paul CaldwellWendling, DO 02/20/18  3:03 PM

## 2018-02-20 NOTE — Addendum Note (Signed)
Addended by: Scharlene GlossEWING, Sonya Pucci B on: 02/20/2018 03:53 PM   Modules accepted: Orders

## 2018-02-20 NOTE — Addendum Note (Signed)
Addended by: Scharlene GlossEWING, Travaris Kosh B on: 02/20/2018 04:16 PM   Modules accepted: Orders

## 2018-02-20 NOTE — Progress Notes (Signed)
Pre visit review using our clinic review tool, if applicable. No additional management support is needed unless otherwise documented below in the visit note. 

## 2018-02-21 LAB — LIPID PANEL
Cholesterol: 166 mg/dL (ref 0–200)
HDL: 67.8 mg/dL (ref >39.00)
LDL Cholesterol: 85 mg/dL (ref 0–99)
NonHDL: 97.9
Total CHOL/HDL Ratio: 2
Triglycerides: 66 mg/dL (ref 0.0–149.0)
VLDL: 13.2 mg/dL (ref 0.0–40.0)

## 2018-02-21 LAB — URIC ACID: URIC ACID, SERUM: 3.2 mg/dL (ref 2.4–7.0)

## 2018-02-21 LAB — MICROALBUMIN / CREATININE URINE RATIO
Creatinine,U: 24.3 mg/dL
MICROALB/CREAT RATIO: 2.9 mg/g (ref 0.0–30.0)

## 2018-03-03 ENCOUNTER — Other Ambulatory Visit: Payer: Self-pay | Admitting: Family Medicine

## 2018-03-03 MED ORDER — FUROSEMIDE 20 MG PO TABS: 20.0000 mg | ORAL_TABLET | Freq: Every day | ORAL | 0 refills | Status: DC | PRN

## 2018-03-03 MED ORDER — AMLODIPINE BESYLATE 5 MG PO TABS: 5.0000 mg | ORAL_TABLET | Freq: Two times a day (BID) | ORAL | 0 refills | Status: DC

## 2018-03-03 MED ORDER — METOPROLOL TARTRATE 50 MG PO TABS: 50.0000 mg | ORAL_TABLET | ORAL | 0 refills | Status: DC | PRN

## 2018-03-03 MED ORDER — LOSARTAN POTASSIUM 100 MG PO TABS: 100.0000 mg | ORAL_TABLET | Freq: Every day | ORAL | 3 refills | Status: DC

## 2018-03-17 ENCOUNTER — Ambulatory Visit: Payer: BLUE CROSS/BLUE SHIELD | Admitting: Gastroenterology

## 2018-03-25 ENCOUNTER — Ambulatory Visit: Payer: BLUE CROSS/BLUE SHIELD | Admitting: Cardiology

## 2018-03-27 ENCOUNTER — Encounter: Payer: Self-pay | Admitting: Cardiology

## 2018-04-24 ENCOUNTER — Encounter: Payer: Self-pay | Admitting: Family Medicine

## 2018-05-03 ENCOUNTER — Other Ambulatory Visit: Payer: Self-pay | Admitting: Family Medicine

## 2019-03-16 ENCOUNTER — Ambulatory Visit: Payer: BLUE CROSS/BLUE SHIELD | Admitting: Family Medicine

## 2019-03-17 ENCOUNTER — Ambulatory Visit (INDEPENDENT_AMBULATORY_CARE_PROVIDER_SITE_OTHER): Payer: 59 | Admitting: Family Medicine

## 2019-03-17 ENCOUNTER — Other Ambulatory Visit: Payer: Self-pay

## 2019-03-17 ENCOUNTER — Encounter: Payer: Self-pay | Admitting: Family Medicine

## 2019-03-17 VITALS — BP 140/76 | HR 72 | Temp 97.8°F | Ht 60.0 in | Wt 174.0 lb

## 2019-03-17 DIAGNOSIS — M7501 Adhesive capsulitis of right shoulder: Secondary | ICD-10-CM

## 2019-03-17 DIAGNOSIS — M79601 Pain in right arm: Secondary | ICD-10-CM | POA: Diagnosis not present

## 2019-03-17 MED ORDER — FUROSEMIDE 20 MG PO TABS: 20.0000 mg | ORAL_TABLET | Freq: Every day | ORAL | 0 refills | Status: DC | PRN

## 2019-03-17 MED ORDER — CHLORTHALIDONE 25 MG PO TABS: 25.0000 mg | ORAL_TABLET | Freq: Every day | ORAL | 1 refills | Status: DC

## 2019-03-17 MED ORDER — CYCLOBENZAPRINE HCL 10 MG PO TABS: 5.0000 mg | ORAL_TABLET | Freq: Three times a day (TID) | ORAL | 0 refills | Status: DC | PRN

## 2019-03-17 MED ORDER — ATORVASTATIN CALCIUM 40 MG PO TABS: 40.0000 mg | ORAL_TABLET | Freq: Every day | ORAL | 3 refills | Status: DC

## 2019-03-17 MED ORDER — CARVEDILOL 6.25 MG PO TABS: 6.2500 mg | ORAL_TABLET | Freq: Two times a day (BID) | ORAL | 8 refills | Status: DC

## 2019-03-17 MED ORDER — AMLODIPINE BESYLATE 5 MG PO TABS: 5.0000 mg | ORAL_TABLET | Freq: Two times a day (BID) | ORAL | 1 refills | Status: DC

## 2019-03-17 MED ORDER — METOPROLOL TARTRATE 50 MG PO TABS: 50.0000 mg | ORAL_TABLET | ORAL | 0 refills | Status: DC | PRN

## 2019-03-17 MED ORDER — MELOXICAM 15 MG PO TABS: 15.0000 mg | ORAL_TABLET | Freq: Every day | ORAL | 0 refills | Status: DC

## 2019-03-17 MED ORDER — LOSARTAN POTASSIUM 100 MG PO TABS: 100.0000 mg | ORAL_TABLET | Freq: Every day | ORAL | 3 refills | Status: DC

## 2019-03-17 MED ORDER — METHYLPREDNISOLONE ACETATE 40 MG/ML IJ SUSP
40.0000 mg | Freq: Once | INTRAMUSCULAR | Status: AC
  Administered 2019-03-17: 40 mg via INTRA_ARTICULAR

## 2019-03-17 NOTE — Progress Notes (Signed)
Musculoskeletal Exam  Patient: Elizabeth [name] DOB: Jul 21, 1955  DOS: 03/17/2019  SUBJECTIVE:  Chief Complaint:   Chief Complaint  Patient presents with  . Follow-up    right arm pain    Darnise Montag is a 64 y.o.  female for evaluation and treatment of R arm pain. She is here with her husband who helps interpret.  Onset:  2 months ago. No inj or change in activity.  Location: upper R arm Character:  sharp  Progression of issue:  has worsened Associated symptoms: decreased ROM Treatment: to date has been acetaminophen and various topicals.   Neurovascular symptoms: no She has received an injection on the left side in the past that did help.  ROS: Musculoskeletal/Extremities: +R arm/shoulder pain Neuro: No weakness  Past Medical History:  Diagnosis Date  . Hypertension     Objective: VITAL SIGNS: BP 140/76 (BP Location: Left Arm, Patient Position: Sitting, Cuff Size: Normal)   Pulse 72   Temp 97.8 F (36.6 C) (Temporal)   Ht 5' (1.524 m)   Wt 174 lb (78.9 kg)   SpO2 95%   BMI 33.98 kg/m  Constitutional: Well formed, well developed. No acute distress. Cardiovascular: Brisk cap refill Thorax & Lungs: No accessory muscle use Musculoskeletal: R arm/shoulder.   Normal active range of motion: no.   Normal passive range of motion: no Tenderness to palpation: Yes, over the biceps, triceps, and lateral deltoid Deformity: no Ecchymosis: no Shoulder testing is largely equivocal due to restricted range of motion Neurologic: Normal sensory function. No focal deficits noted. DTR's equal and symmetric in UE's. No clonus. Psychiatric: Normal mood. Age appropriate judgment and insight. Alert & oriented x 3.    Procedure Note; Shoulder bursa injection Verbal consent obtained. The area was palpated, an area was marked just caudal to the acromion process laterally, and cleaned with Betadine x1. A 27-gauge needle was used to enter the joint laterally with ease. 40 mg of  Depomedrol with 2 mL of 1% lidocaine was injected. The patient tolerated the procedure well. There were no complications noted.  Assessment:  Adhesive capsulitis of right shoulder - Plan: methylPREDNISolone acetate (DEPO-MEDROL) injection 40 mg, PR DRAIN/INJECT LARGE JOINT/BURSA  Pain of right upper extremity  Plan: Stretches and exercises for the shoulder, biceps, and triceps. Heat, ice, Tylenol. Injection today. Many of her chronic medications were also refilled. I have not seen her in over a year due to insurance issues. F/u in 1 month for a physical. If she is no better with the upper extremity, will refer to physical therapy versus sports medicine. The patient and her husband voiced understanding and agreement to the plan.   Jilda Roche [place], DO 03/17/19  11:58 AM

## 2019-03-17 NOTE — Patient Instructions (Signed)
Heat (pad or rice pillow in microwave) over affected area, 10-15 minutes twice daily.   Ice/cold pack over area for 10-15 min twice daily.  OK to take Tylenol 1000 mg (2 extra strength tabs) or 975 mg (3 regular strength tabs) every 6 hours as needed.  EXERCISES  RANGE OF MOTION (ROM) AND STRETCHING EXERCISES These exercises may help you when beginning to rehabilitate your injury. While completing these exercises, remember:   Restoring tissue flexibility helps normal motion to return to the joints. This allows healthier, less painful movement and activity.  An effective stretch should be held for at least 30 seconds.  A stretch should never be painful. You should only feel a gentle lengthening or release in the stretched tissue.  ROM - Pendulum  Bend at the waist so that your right / left arm falls away from your body. Support yourself with your opposite hand on a solid surface, such as a table or a countertop.  Your right / left arm should be perpendicular to the ground. If it is not perpendicular, you need to lean over farther. Relax the muscles in your right / left arm and shoulder as much as possible.  Gently sway your hips and trunk so they move your right / left arm without any use of your right / left shoulder muscles.  Progress your movements so that your right / left arm moves side to side, then forward and backward, and finally, both clockwise and counterclockwise.  Complete 10-15 repetitions in each direction. Many people use this exercise to relieve discomfort in their shoulder as well as to gain range of motion. Repeat 2 times. Complete this exercise 3 times per week.  STRETCH - Flexion, Standing  Stand with good posture. With an underhand grip on your right / left hand and an overhand grip on the opposite hand, grasp a broomstick or cane so that your hands are a little more than shoulder-width apart.  Keeping your right / left elbow straight and shoulder muscles  relaxed, push the stick with your opposite hand to raise your right / left arm in front of your body and then overhead. Raise your arm until you feel a stretch in your right / left shoulder, but before you have increased shoulder pain.  Try to avoid shrugging your right / left shoulder as your arm rises by keeping your shoulder blade tucked down and toward your mid-back spine. Hold 30 seconds.  Slowly return to the starting position. Repeat 2 times. Complete this exercise 3 times per week.  STRETCH - Internal Rotation  Place your right / left hand behind your back, palm-up.  Throw a towel or belt over your opposite shoulder. Grasp the towel/belt with your right / left hand.  While keeping an upright posture, gently pull up on the towel/belt until you feel a stretch in the front of your right / left shoulder.  Avoid shrugging your right / left shoulder as your arm rises by keeping your shoulder blade tucked down and toward your mid-back spine.  Hold 30. Release the stretch by lowering your opposite hand. Repeat 2 times. Complete this exercise 3 times per week.  STRETCH - External Rotation and Abduction  Stagger your stance through a doorframe. It does not matter which foot is forward.  As instructed by your physician, physical therapist or athletic trainer, place your hands: ? And forearms above your head and on the door frame. ? And forearms at head-height and on the door frame. ? At   elbow-height and on the door frame.  Keeping your head and chest upright and your stomach muscles tight to prevent over-extending your low-back, slowly shift your weight onto your front foot until you feel a stretch across your chest and/or in the front of your shoulders.  Hold 30 seconds. Shift your weight to your back foot to release the stretch. Repeat 2 times. Complete this stretch 3 times per week.   STRENGTHENING EXERCISES  These exercises may help you when beginning to rehabilitate your injury.  They may resolve your symptoms with or without further involvement from your physician, physical therapist or athletic trainer. While completing these exercises, remember:   Muscles can gain both the endurance and the strength needed for everyday activities through controlled exercises.  Complete these exercises as instructed by your physician, physical therapist or athletic trainer. Progress the resistance and repetitions only as guided.  You may experience muscle soreness or fatigue, but the pain or discomfort you are trying to eliminate should never worsen during these exercises. If this pain does worsen, stop and make certain you are following the directions exactly. If the pain is still present after adjustments, discontinue the exercise until you can discuss the trouble with your clinician.  If advised by your physician, during your recovery, avoid activity or exercises which involve actions that place your right / left hand or elbow above your head or behind your back or head. These positions stress the tissues which are trying to heal.  STRENGTH - Scapular Depression and Adduction  With good posture, sit on a firm chair. Supported your arms in front of you with pillows, arm rests or a table top. Have your elbows in line with the sides of your body.  Gently draw your shoulder blades down and toward your mid-back spine. Gradually increase the tension without tensing the muscles along the top of your shoulders and the back of your neck.  Hold for 3 seconds. Slowly release the tension and relax your muscles completely before completing the next repetition.  After you have practiced this exercise, remove the arm support and complete it in standing as well as sitting. Repeat 2 times. Complete this exercise 3 times per week.   STRENGTH - External Rotators  Secure a rubber exercise band/tubing to a fixed object so that it is at the same height as your right / left elbow when you are standing  or sitting on a firm surface.  Stand or sit so that the secured exercise band/tubing is at your side that is not injured.  Bend your elbow 90 degrees. Place a folded towel or small pillow under your right / left arm so that your elbow is a few inches away from your side.  Keeping the tension on the exercise band/tubing, pull it away from your body, as if pivoting on your elbow. Be sure to keep your body steady so that the movement is only coming from your shoulder rotating.  Hold 3 seconds. Release the tension in a controlled manner as you return to the starting position. Repeat 2 times. Complete this exercise 3 times per week.   STRENGTH - Supraspinatus  Stand or sit with good posture. Grasp a 2-3 lb weight or an exercise band/tubing so that your hand is "thumbs-up," like when you shake hands.  Slowly lift your right / left hand from your thigh into the air, traveling about 30 degrees from straight out at your side. Lift your hand to shoulder height or as far as you   can without increasing any shoulder pain. Initially, many people do not lift their hands above shoulder height.  Avoid shrugging your right / left shoulder as your arm rises by keeping your shoulder blade tucked down and toward your mid-back spine.  Hold for 3 seconds. Control the descent of your hand as you slowly return to your starting position. Repeat 2 times. Complete this exercise 3 times per week.   STRENGTH - Shoulder Extensors  Secure a rubber exercise band/tubing so that it is at the height of your shoulders when you are either standing or sitting on a firm arm-less chair.  With a thumbs-up grip, grasp an end of the band/tubing in each hand. Straighten your elbows and lift your hands straight in front of you at shoulder height. Step back away from the secured end of band/tubing until it becomes tense.  Squeezing your shoulder blades together, pull your hands down to the sides of your thighs. Do not allow your hands  to go behind you.  Hold for 3 seconds. Slowly ease the tension on the band/tubing as you reverse the directions and return to the starting position. Repeat 2 times. Complete this exercise 3 times per week.   STRENGTH - Scapular Retractors  Secure a rubber exercise band/tubing so that it is at the height of your shoulders when you are either standing or sitting on a firm arm-less chair.  With a palm-down grip, grasp an end of the band/tubing in each hand. Straighten your elbows and lift your hands straight in front of you at shoulder height. Step back away from the secured end of band/tubing until it becomes tense.  Squeezing your shoulder blades together, draw your elbows back as you bend them. Keep your upper arm lifted away from your body throughout the exercise.  Hold 3 seconds. Slowly ease the tension on the band/tubing as you reverse the directions and return to the starting position. Repeat 2 times. Complete this exercise 3 times per week.  STRENGTH - Scapular Depressors  Find a sturdy chair without wheels, such as a from a dining room table.  Keeping your feet on the floor, lift your bottom from the seat and lock your elbows.  Keeping your elbows straight, allow gravity to pull your body weight down. Your shoulders will rise toward your ears.  Raise your body against gravity by drawing your shoulder blades down your back, shortening the distance between your shoulders and ears. Although your feet should always maintain contact with the floor, your feet should progressively support less body weight as you get stronger.  Hold 3 seconds. In a controlled and slow manner, lower your body weight to begin the next repetition. Repeat 2 times. Complete this exercise 3 times per week.   This information is not intended to replace advice given to you by your health care provider. Make sure you discuss any questions you have with your health care provider.  Document Released: 01/10/2005  Document Revised: 03/19/2014 Document Reviewed: 06/10/2008 Elsevier Interactive Patient Education 2016 [employer]   Triceps Tendon Rupture (Distal) Rehab Ask your health care provider which exercises are safe for you. Do exercises exactly as told by your health care provider and adjust them as directed. It is normal to feel mild stretching, pulling, tightness, or discomfort as you do these exercises, but you should stop right away if you feel sudden pain or your pain gets worse.Do not begin these exercises until told by your health care provider. Stretching and range of motion exercises  These exercises warm up your muscles and joints and improve the movement and flexibility of your arm. These exercises can also help to relieve pain, numbness, and tingling. Exercise A: Elbow flexion, passive  1. Stand or sit with your left / right arm at your side. 2. Use your other hand to gently push your left / right hand toward your shoulder. Bend your elbow as far as your health care provider tells you to. You may be instructed to try to bend your elbow more and more over time. 3. Hold for 30 seconds. 4. Slowly return to the starting position. Repeat 2 times. Complete this exercise 3 times a week. Exercise B: Supination, passive  1. Sit with your left / right elbow bent to an "L" shape (90 degrees) and your forearm resting on a table, palm-down. 2. Keeping your upper body and shoulder still, use your other hand to rotate your left / right palm up. Stop when you feel a gentle to moderate stretch. 3. Hold for 30 seconds. 4. Slowly return to the starting position. Repeat 2 times. Complete this exercise 3 times a week. Exercise C: Pronation, passive 1. Sit with your left / right elbow bent to an "L" shape (90 degrees) and your forearm resting on a table, palm-up. 2. Keeping your upper body and shoulder still, use your other hand to rotate your left / right palm down. Stop when you feel a gentle to  moderate stretch. 3. Hold for 30 seconds. 4. Slowly return to the starting position. Repeat 2 times. Complete this exercise 3 times a week. Exercise D: Elbow extension, passive, supine 1. Lie on your back in a comfortable position that allows you to relax your arm muscles. 2. Place a folded towel under your left / right upper arm so your elbow and shoulder are at the same height. 3. Hold your left / right arm out straight with your other hand supporting it. Use your other arm to raise your left / right arm until your elbow does not rest on the bed or towel. Let the weight of your hand stretch the inside of your left / right elbow. ? Keep your arm and chest muscles relaxed. ? If directed, you may hold a 5 lb weight in your hand to increase the intensity of the stretch. 4. Hold for 30 seconds. 5. Slowly release the stretch. Repeat 2 times. Complete this exercise 3 times a week. Exercise E: Elbow flexion, active Start this exercise only when your healthcare provider tells you. 1. Stand or sit with your left / right elbow bent and your palm facing in, toward your body. 2. Bend your elbow as far as you can using only your arm muscles. 3. Hold for 30 seconds. 4. Slowly return to the starting position. Repeat 2 times. Complete this exercise 3 times a week. Exercise F: Supination, active Start this exercise only when your healthcare provider tells you. 1. Stand or sit with your left / right elbow bent to an "L" shape (90 degrees). 2. Rotate your palm up until you feel a gentle stretch on the inside of your forearm. 3. Hold for 30 seconds. 4. Slowly return to the starting position. Repeat 2 times. Complete this exercise 3 times a week. Exercise G: Pronation, active  Start this exercise only when your healthcare provider tells you. 1. Stand or sit with your left / right elbow bent to an "L" shape (90 degrees). 2. Rotate your palm down until you feel a gentle stretch on  the top of your  forearm. 3. Hold for 30 seconds. 4. Slowly release and return to the starting position. Repeat 2 times. Complete this exercise 3 times a week. Exercise H: Elbow extension, active Start this exercise only when your healthcare provider tells you. 1. Stand or sit with your left / right elbow bent and your palm facing in, toward your body. 2. Slowly straighten your elbow using only your arm muscles. Stop when you feel a very slight stretch at the front of your arm, or when you reach the position where your health care provider tells you to stop. You may be instructed to try to straighten your arm more and more each week. 3. Hold for 30 seconds. 4. Slowly return to the starting position. Repeat 2 times. Complete this exercise 3 times a week. Strengthening exercises These exercises build strength and endurance in your arm and shoulder. Endurance is the ability to use your muscles for a long time, even after your muscles get tired. Do not start any strengthening exercises until told by your health care provider. Exercise I: Elbow flexion, isometric  1. Stand or sit with your left / right arm at waist height. Your palm should face in, toward your body. 2. Place your other hand on top of your left / right forearm. Gently push down while you resist with your left / right arm. ? Use about half (50%) effort with both arms. You may be instructed to use more and more effort with your arms each week. ? Try not to let your right / left elbow move. 3. Hold for 3 seconds. 4. Let your muscles relax completely before you repeat this exercise. Repeat 2 times. Complete this exercise 3 times a week. Exercise J: Elbow extension, isometric  1. Stand or sit with your left / right arm at waist height. Your palm should face in, toward your body. 2. Place your other hand on the bottom of your left / right forearm. Gently push up while you resist with your left / right arm. ? Use about half (50%) effort with both arms.  You may be instructed to use more and more effort with your arms each week. ? Try not to let your left / right elbow move. 3. Hold for 3 seconds. 4. Let your muscles relax completely before you repeat this exercise. Repeat 2 times. Complete this exercise 3 times a week. Exercise K: Biceps curls (elbow flexion, supinated) 1. Sit on a stable chair without armrests, or stand. 2. Hold a 5 lbweight in your left / right hand. Your palm should face out, away from your body, at the starting position. 3. Bend your left / right elbow and move your hand up toward your shoulder. 4. Slowly return to the starting position. Repeat 2 times. Complete this exercise 3 times a week. Exercise L: Hammer curls (elbow flexion, neutral forearm) 1. Sit on a stable chair without armrests, or stand. 2. Hold a 5 weight in your left / right hand, or hold an exercise band with both hands. Your palms should face each other at the starting position. 3. Bend your left / right elbow and move your hand up toward your shoulder. Keep your other arm straight. 4. Slowly return to the starting position. Repeat 2 times. Complete this exercise 3 times a week. Exercise M: Triceps curls ( elbow extension) 1. Lie on your back. 2. Hold a 5 lb weight in your left / right hand. 3. Bend your left / right elbow  to an "L" shape (90 degrees) so the weight is in front of your face, over your chest, and your elbow is pointed up to the ceiling. 4. Straighten your elbow, raising your hand toward the ceiling. Use your other hand to support your left / right upper arm. 5. Slowly return to the starting position. Repeat 2 times. Complete this exercise 3 times a week. Exercise N: Elbow extension with exercise band  1. Sit on a stable chair without armrests, or stand. 2. Hold an exercise band in both hands. 3. Keeping your upper arms at your side, bring both hands up to your shoulder. Keep your left / right hand just below your other  hand. 4. Straighten your left / right elbow while keeping your other arm still. 5. Hold for 3 seconds. 6. Slowly bend your elbow to return to the starting position. Repeat 2 times. Complete this exercise 3 times a week. Exercise O: Supination  1. Sit with your left / right forearm supported on a table. Your elbow should be at waist height. 2. Rest your hand over the edge of the table, palm-down. 3. Gently grasp a lightweight hammer. 4. Without moving your left / right elbow, slowly rotate your palm up, to a "thumbs-up" position. 5. Hold for3 seconds. 6. Slowly return to the starting position. Repeat 2 times. Complete this exercise 3 times a week. Exercise P: Pronation  1. Sit with your left / right forearm supported on a table. Your elbow should be at waist height. 2. Rest your hand over the edge of the table, palm-up. 3. Gently grasp a lightweight hammer. 4. Without moving your left / right elbow, slowly rotate your palm down. 5. Hold for 3 seconds. 6. Slowly return to the starting position. Repeat 2 times. Complete this exercise 3 times per week. This information is not intended to replace advice given to you by your health care provider. Make sure you discuss any questions you have with your health care provider. Document Released: 02/26/2005 Document Revised: 11/03/2015 Document Reviewed: 11/21/2014 [employer] Interactive Patient Education  [employer].

## 2019-04-15 ENCOUNTER — Ambulatory Visit: Payer: 59 | Admitting: Family Medicine

## 2019-04-19 NOTE — Progress Notes (Signed)
Cardiology Office Note   Date:  04/20/2019   ID:  Elizabeth [name], DOB 11-Feb-1956, MRN [id number]  PCP:  Elizabeth Pal, DO    No chief complaint on file.  Hypertension  Wt Readings from Last 3 Encounters:  04/20/19 174 lb 12.8 oz (79.3 kg)  03/17/19 174 lb (78.9 kg)  02/20/18 169 lb 6 oz (76.8 kg)       History of Present Illness: Elizabeth [name] is a 64 y.o. female  who had HTN. She describes herself as anxious and this makes the BP increase as well. She lived in [place] before coming to [place]. BP at home in [place] was 130/80. Once a month, she may have a spike as described. She reports headache with the high readings.   She had a kidney stone and had lithotripsy in [place]. She uses some cystone for her pain.  In the past, she has some elevated blood pressure readings in the evening.  She also has had difficulty losing weight.  She has occasional swelling in her legs.   She was prescribed a diuretic in 2019. Amlodipine was changed to twice daily to help with evening readings.  Due to insurance, they had to go to [facility] in 2020. Due to COVID, they did not see the cardiologist. THey switched insurance.  BP gets higher in the winter.  Highest is in the 160s, but this has resolved.  Denies : Chest pain. Dizziness. Leg edema. Nitroglycerin use. Orthopnea. Palpitations. Paroxysmal nocturnal dyspnea. Shortness of breath. Syncope.   Had some right shoulder problems, mild improvement with injection.   Past Medical History:  Diagnosis Date  . Hypertension     Past Surgical History:  Procedure Laterality Date  . CESAREAN SECTION       Current Outpatient Medications  Medication Sig Dispense Refill  . amLODipine (NORVASC) 5 MG tablet Take 1 tablet (5 mg total) by mouth 2 (two) times daily. Or as directed 60 tablet 1  . atorvastatin (LIPITOR) 40 MG tablet Take 1 tablet (40 mg total) by mouth daily. 90 tablet 3  . blood glucose meter kit and supplies KIT  daily as needed. Dispense based on patient and insurance preference. Use up to once  daily as directed.    . carvedilol (COREG) 6.25 MG tablet Take 1 tablet (6.25 mg total) by mouth 2 (two) times daily with a meal. 60 tablet 8  . chlorthalidone (HYGROTON) 25 MG tablet Take 1 tablet (25 mg total) by mouth daily. 90 tablet 1  . cyclobenzaprine (FLEXERIL) 10 MG tablet Take 0.5-1 tablets (5-10 mg total) by mouth 3 (three) times daily as needed for muscle spasms. 21 tablet 0  . furosemide (LASIX) 20 MG tablet Take 1 tablet (20 mg total) by mouth daily as needed (swelling). 30 tablet 0  . glucose blood (IGLUCOSE TEST STRIPS) test strip Dispense based on patient and insurance preference. Test once daily to check blood sugar. 100 each 12  . guanFACINE (TENEX) 1 MG tablet Take 1 mg by mouth at bedtime. Reported on 04/22/2015  0  . Lancets MISC Dispense based on patient and insurance preference.  Test once daily to check blood sugar.    . losartan (COZAAR) 100 MG tablet Take 1 tablet (100 mg total) by mouth daily. 30 tablet 3  . meloxicam (MOBIC) 15 MG tablet Take 1 tablet (15 mg total) by mouth daily. 30 tablet 0  . metoprolol tartrate (LOPRESSOR) 50 MG tablet Take 1 tablet (50 mg total) by  mouth as needed. Take 50 mg by mouth once as needed if blood pressure 150 to 180 30 tablet 0  . RABEprazole (ACIPHEX) 20 MG tablet TAKE 1 TABLET(20 MG) BY MOUTH TWICE DAILY 60 tablet 2   No current facility-administered medications for this visit.    Allergies:   Patient has no known allergies.    Social History:  The patient  reports that she has never smoked. She has never used smokeless tobacco. She reports that she does not drink alcohol or use drugs.   Family History:  The patient's family history includes Hypertension in her mother.    ROS:  Please see the history of present illness.   Otherwise, review of systems are positive for occasional high BP readings.   All other systems are reviewed and negative.     PHYSICAL EXAM: VS:  BP 124/72   Pulse 70   Ht 5' (1.524 m)   Wt 174 lb 12.8 oz (79.3 kg)   SpO2 98%   BMI 34.14 kg/m  , BMI Body mass index is 34.14 kg/m. GEN: Well nourished, well developed, in no acute distress  HEENT: normal  Neck: no JVD, carotid bruits, or masses Cardiac: RRR; no murmurs, rubs, or gallops,no edema  Respiratory:  clear to auscultation bilaterally, normal work of breathing GI: soft, nontender, nondistended, + BS MS: no deformity or atrophy  Skin: warm and dry, no rash Neuro:  Strength and sensation are intact Psych: euthymic mood, full affect   EKG:   The ekg ordered today demonstrates NSR, no ST changes   Recent Labs: No results found for requested labs within last 8760 hours.   Lipid Panel    Component Value Date/Time   CHOL 166 02/20/2018 1537   TRIG 66.0 02/20/2018 1537   HDL 67.80 02/20/2018 1537   CHOLHDL 2 02/20/2018 1537   VLDL 13.2 02/20/2018 1537   LDLCALC 85 02/20/2018 1537     Other studies Reviewed: Additional studies/ records that were reviewed today with results demonstrating: LDL 85 in 12/19.   ASSESSMENT AND PLAN:  1. Hypertension: Twice daily amlodipine was used to help with evening blood pressures. 2. Hyperlipidemia: Continue atorvastatin.  Check lipids today.  3. Obesity: Healthy diet, low-salt diet recommended. Whole food, plant-based foods will be healthy. 4. Lower extremity edema: She should elevate her legs. She can use furosemide as needed. Supplement potassium through diet if she does use potassium. Minimize salt in diet as well. WIll check labs.  5. Prediabetes: A1C 6.7 in 2019. Will recheck.    Current medicines are reviewed at length with the patient today.  The patient concerns regarding her medicines were addressed.  The following changes have been made:  No change  Labs/ tests ordered today include:  No orders of the defined types were placed in this encounter.   Recommend 150 minutes/week of aerobic  exercise Low fat, low carb, high fiber diet recommended  Disposition:   FU in 1 year   Signed, Elizabeth Grooms, MD  04/20/2019 8:40 AM    [facility] [place], [place], [place]  [address] Phone: [phone]; Fax: [phone]

## 2019-04-20 ENCOUNTER — Other Ambulatory Visit: Payer: Self-pay

## 2019-04-20 ENCOUNTER — Encounter: Payer: Self-pay | Admitting: Interventional Cardiology

## 2019-04-20 ENCOUNTER — Ambulatory Visit (INDEPENDENT_AMBULATORY_CARE_PROVIDER_SITE_OTHER): Payer: 59 | Admitting: Interventional Cardiology

## 2019-04-20 VITALS — BP 124/72 | HR 70 | Ht 60.0 in | Wt 174.8 lb

## 2019-04-20 DIAGNOSIS — R7303 Prediabetes: Secondary | ICD-10-CM

## 2019-04-20 DIAGNOSIS — E669 Obesity, unspecified: Secondary | ICD-10-CM | POA: Diagnosis not present

## 2019-04-20 DIAGNOSIS — E782 Mixed hyperlipidemia: Secondary | ICD-10-CM

## 2019-04-20 DIAGNOSIS — M25471 Effusion, right ankle: Secondary | ICD-10-CM

## 2019-04-20 DIAGNOSIS — I10 Essential (primary) hypertension: Secondary | ICD-10-CM

## 2019-04-20 DIAGNOSIS — M25472 Effusion, left ankle: Secondary | ICD-10-CM

## 2019-04-20 LAB — COMPREHENSIVE METABOLIC PANEL
ALT: 13 IU/L (ref 0–32)
AST: 12 IU/L (ref 0–40)
Albumin/Globulin Ratio: 1.4 (ref 1.2–2.2)
Albumin: 4 g/dL (ref 3.8–4.8)
Alkaline Phosphatase: 87 IU/L (ref 39–117)
BUN/Creatinine Ratio: 11 — ABNORMAL LOW (ref 12–28)
BUN: 10 mg/dL (ref 8–27)
Bilirubin Total: 0.4 mg/dL (ref 0.0–1.2)
CO2: 24 mmol/L (ref 20–29)
Calcium: 9.4 mg/dL (ref 8.7–10.3)
Chloride: 103 mmol/L (ref 96–106)
Creatinine, Ser: 0.88 mg/dL (ref 0.57–1.00)
GFR calc Af Amer: 81 mL/min/{1.73_m2} (ref >59)
GFR calc non Af Amer: 71 mL/min/{1.73_m2} (ref >59)
Globulin, Total: 2.9 g/dL (ref 1.5–4.5)
Glucose: 143 mg/dL — ABNORMAL HIGH (ref 65–99)
Potassium: 4.3 mmol/L (ref 3.5–5.2)
Sodium: 141 mmol/L (ref 134–144)
Total Protein: 6.9 g/dL (ref 6.0–8.5)

## 2019-04-20 LAB — CBC
Hematocrit: 38.6 % (ref 34.0–46.6)
Hemoglobin: 12.6 g/dL (ref 11.1–15.9)
MCH: 28.8 pg (ref 26.6–33.0)
MCHC: 32.6 g/dL (ref 31.5–35.7)
MCV: 88 fL (ref 79–97)
Platelets: 182 10*3/uL (ref 150–450)
RBC: 4.38 x10E6/uL (ref 3.77–5.28)
RDW: 12.4 % (ref 11.7–15.4)
WBC: 5.3 10*3/uL (ref 3.4–10.8)

## 2019-04-20 LAB — HEMOGLOBIN A1C
Est. average glucose Bld gHb Est-mCnc: 146 mg/dL
Hgb A1c MFr Bld: 6.7 % — ABNORMAL HIGH (ref 4.8–5.6)

## 2019-04-20 LAB — LIPID PANEL
Chol/HDL Ratio: 2.1 ratio (ref 0.0–4.4)
Cholesterol, Total: 160 mg/dL (ref 100–199)
HDL: 77 mg/dL (ref >39)
LDL Chol Calc (NIH): 73 mg/dL (ref 0–99)
Triglycerides: 46 mg/dL (ref 0–149)
VLDL Cholesterol Cal: 10 mg/dL (ref 5–40)

## 2019-04-20 NOTE — Patient Instructions (Signed)
Medication Instructions:  Your physician recommends that you continue on your current medications as directed. Please refer to the Current Medication list given to you today.  *If you need a refill on your cardiac medications before your next appointment, please call your pharmacy*  Lab Work: TODAY: CMET, LIPIDS, A1C, CBC  If you have labs (blood work) drawn today and your tests are completely normal, you will receive your results only by: Marland Kitchen MyChart Message (if you have MyChart) OR . A paper copy in the mail If you have any lab test that is abnormal or we need to change your treatment, we will call you to review the results.  Testing/Procedures: None ordered  Follow-Up: At [facility], you and your health needs are our priority.  As part of our continuing mission to provide you with exceptional heart care, we have created designated Provider Care Teams.  These Care Teams include your primary Cardiologist (physician) and Advanced Practice Providers (APPs -  Physician Assistants and Nurse Practitioners) who all work together to provide you with the care you need, when you need it.  Your next appointment:   12 month(s)  The format for your next appointment:   In Person  Provider:   You may see Lance Muss, MD or one of the following Advanced Practice Providers on your designated Care Team:    Ronie Spies, PA-C  Jacolyn Reedy, PA-C   Other Instructions

## 2019-04-21 ENCOUNTER — Encounter: Payer: 59 | Admitting: Family Medicine

## 2019-04-27 ENCOUNTER — Other Ambulatory Visit: Payer: Self-pay | Admitting: Family Medicine

## 2019-04-27 MED ORDER — METOPROLOL TARTRATE 50 MG PO TABS: 50.0000 mg | ORAL_TABLET | ORAL | 2 refills | Status: DC | PRN

## 2019-05-19 ENCOUNTER — Encounter: Payer: 59 | Admitting: Family Medicine

## 2019-05-25 ENCOUNTER — Other Ambulatory Visit: Payer: Self-pay | Admitting: Family Medicine

## 2019-05-25 MED ORDER — AMLODIPINE BESYLATE 5 MG PO TABS: 5.0000 mg | ORAL_TABLET | Freq: Two times a day (BID) | ORAL | 1 refills | Status: DC

## 2019-06-30 ENCOUNTER — Encounter: Payer: 59 | Admitting: Family Medicine

## 2019-07-06 ENCOUNTER — Encounter: Payer: 59 | Admitting: Family Medicine

## 2019-07-08 ENCOUNTER — Encounter: Payer: 59 | Admitting: Family Medicine

## 2019-07-17 ENCOUNTER — Other Ambulatory Visit: Payer: Self-pay | Admitting: Family Medicine

## 2019-07-17 MED ORDER — AMLODIPINE BESYLATE 5 MG PO TABS: 5.0000 mg | ORAL_TABLET | Freq: Two times a day (BID) | ORAL | 3 refills | Status: DC

## 2019-07-29 ENCOUNTER — Telehealth: Payer: Self-pay

## 2019-07-29 MED ORDER — METOPROLOL TARTRATE 50 MG PO TABS: 50.0000 mg | ORAL_TABLET | ORAL | 2 refills | Status: DC | PRN

## 2019-07-29 NOTE — Telephone Encounter (Signed)
Medication refill

## 2019-08-11 ENCOUNTER — Encounter: Payer: Self-pay | Admitting: Family Medicine

## 2019-08-11 ENCOUNTER — Other Ambulatory Visit: Payer: Self-pay

## 2019-08-11 ENCOUNTER — Ambulatory Visit (INDEPENDENT_AMBULATORY_CARE_PROVIDER_SITE_OTHER): Payer: 59 | Admitting: Family Medicine

## 2019-08-11 VITALS — BP 112/68 | HR 70 | Temp 95.4°F | Ht 60.0 in | Wt 168.0 lb

## 2019-08-11 DIAGNOSIS — M7989 Other specified soft tissue disorders: Secondary | ICD-10-CM | POA: Diagnosis not present

## 2019-08-11 DIAGNOSIS — Z114 Encounter for screening for human immunodeficiency virus [HIV]: Secondary | ICD-10-CM

## 2019-08-11 DIAGNOSIS — E1165 Type 2 diabetes mellitus with hyperglycemia: Secondary | ICD-10-CM

## 2019-08-11 DIAGNOSIS — Z0000 Encounter for general adult medical examination without abnormal findings: Secondary | ICD-10-CM

## 2019-08-11 DIAGNOSIS — B353 Tinea pedis: Secondary | ICD-10-CM | POA: Diagnosis not present

## 2019-08-11 DIAGNOSIS — Z1159 Encounter for screening for other viral diseases: Secondary | ICD-10-CM

## 2019-08-11 DIAGNOSIS — Z1231 Encounter for screening mammogram for malignant neoplasm of breast: Secondary | ICD-10-CM | POA: Diagnosis not present

## 2019-08-11 LAB — LIPID PANEL
Cholesterol: 157 mg/dL (ref 0–200)
HDL: 48 mg/dL (ref >39.00)
LDL Cholesterol: 83 mg/dL (ref 0–99)
NonHDL: 108.52
Total CHOL/HDL Ratio: 3
Triglycerides: 129 mg/dL (ref 0.0–149.0)
VLDL: 25.8 mg/dL (ref 0.0–40.0)

## 2019-08-11 LAB — COMPREHENSIVE METABOLIC PANEL
ALT: 12 U/L (ref 0–35)
AST: 13 U/L (ref 0–37)
Albumin: 4.1 g/dL (ref 3.5–5.2)
Alkaline Phosphatase: 80 U/L (ref 39–117)
BUN: 21 mg/dL (ref 6–23)
CO2: 28 mEq/L (ref 19–32)
Calcium: 9.5 mg/dL (ref 8.4–10.5)
Chloride: 103 mEq/L (ref 96–112)
Creatinine, Ser: 0.83 mg/dL (ref 0.40–1.20)
GFR: 69.38 mL/min (ref >60.00)
Glucose, Bld: 133 mg/dL — ABNORMAL HIGH (ref 70–99)
Potassium: 4 mEq/L (ref 3.5–5.1)
Sodium: 139 mEq/L (ref 135–145)
Total Bilirubin: 0.3 mg/dL (ref 0.2–1.2)
Total Protein: 6.9 g/dL (ref 6.0–8.3)

## 2019-08-11 LAB — CBC
HCT: 35 % — ABNORMAL LOW (ref 36.0–46.0)
Hemoglobin: 11.7 g/dL — ABNORMAL LOW (ref 12.0–15.0)
MCHC: 33.5 g/dL (ref 30.0–36.0)
MCV: 87.1 fl (ref 78.0–100.0)
Platelets: 188 10*3/uL (ref 150.0–400.0)
RBC: 4.02 Mil/uL (ref 3.87–5.11)
RDW: 12.3 % (ref 11.5–15.5)
WBC: 5.7 10*3/uL (ref 4.0–10.5)

## 2019-08-11 LAB — HEMOGLOBIN A1C: Hgb A1c MFr Bld: 6.5 % (ref 4.6–6.5)

## 2019-08-11 LAB — T4, FREE: Free T4: 3.84 ng/dL — ABNORMAL HIGH (ref 0.60–1.60)

## 2019-08-11 LAB — TSH: TSH: 2.45 u[IU]/mL (ref 0.35–4.50)

## 2019-08-11 MED ORDER — KETOCONAZOLE 2 % EX CREA: 1.0000 "application " | TOPICAL_CREAM | Freq: Every day | CUTANEOUS | 0 refills | Status: AC

## 2019-08-11 NOTE — Patient Instructions (Signed)
Give [place] 2-3 business days to get the results of your labs back.   Keep the diet clean and stay active.  Call Center for [facility] at [facility] at [phone] for an appointment.  They are located at [address], [address], [place], [place], [address] (right across the hall from our office).  If you do not hear anything about your referral in the next 1-2 weeks, call our office and ask for an update.  Stop the amlodipine, keep an eye on your blood pressures.   Let us know if you need anything.

## 2019-08-11 NOTE — Progress Notes (Signed)
Chief Complaint  Patient presents with   Annual Exam     Well Woman Elizabeth [name] is here for a complete physical. Here w spouse.  Her last physical was >1 year ago.  Current diet: in general, a "healthy" diet. Current exercise: none. Weight is stable overall and she denies daytime fatigue. No LMP recorded. Patient is postmenopausal..  Seatbelt? Yes  Health Maintenance Pap/HPV- Yes Mammogram- No Colon cancer screening-No Tetanus- Yes Hep C screening- No HIV screening- No   Patient has a history of diabetes.  She is diet controlled.  Last A1c 6.7.  She does not routinely check her sugars.  Last foot exam was over a year ago.  Her last eye exam was over a year ago.  She would like a referral to one.  Diet and exercise as above.  The patient has a history of high blood pressure.  She takes a variety of medications but is usually well controlled.  Home readings are usually in the 110s-120s/70s.  She is on amlodipine.  She has a history of bilateral lower extremity edema without calf pain.  She does not have compression stockings and rarely elevates her legs.  Past Medical History:  Diagnosis Date   Hypertension      Past Surgical History:  Procedure Laterality Date   CESAREAN SECTION      Medications  Current Outpatient Medications on File Prior to Visit  Medication Sig Dispense Refill   atorvastatin (LIPITOR) 40 MG tablet Take 1 tablet (40 mg total) by mouth daily. 90 tablet 3   blood glucose meter kit and supplies KIT daily as needed. Dispense based on patient and insurance preference. Use up to once  daily as directed.     carvedilol (COREG) 6.25 MG tablet Take 1 tablet (6.25 mg total) by mouth 2 (two) times daily with a meal. 60 tablet 8   glucose blood (IGLUCOSE TEST STRIPS) test strip Dispense based on patient and insurance preference. Test once daily to check blood sugar. 100 each 12   Lancets MISC Dispense based on patient and insurance preference.  Test once  daily to check blood sugar.     losartan (COZAAR) 100 MG tablet Take 1 tablet (100 mg total) by mouth daily. 30 tablet 3   metoprolol tartrate (LOPRESSOR) 50 MG tablet Take 1 tablet (50 mg total) by mouth as needed. Take 50 mg by mouth once as needed if blood pressure 150 to 180 30 tablet 2   RABEprazole (ACIPHEX) 20 MG tablet TAKE 1 TABLET(20 MG) BY MOUTH TWICE DAILY 60 tablet 2   chlorthalidone (HYGROTON) 25 MG tablet Take 1 tablet (25 mg total) by mouth daily. 90 tablet 1   cyclobenzaprine (FLEXERIL) 10 MG tablet Take 0.5-1 tablets (5-10 mg total) by mouth 3 (three) times daily as needed for muscle spasms. 21 tablet 0   furosemide (LASIX) 20 MG tablet Take 1 tablet (20 mg total) by mouth daily as needed (swelling). 30 tablet 0   Allergies No Known Allergies  Review of Systems: Constitutional:  no unexpected weight changes Eye:  no recent significant change in vision Ear/Nose/Mouth/Throat:  Ears:  no recent change in hearing Nose/Mouth/Throat:  no complaints of nasal congestion, no sore throat Cardiovascular: no chest pain, + lower extremity edema Respiratory:  no shortness of breath Gastrointestinal:  no abdominal pain, no change in bowel habits GU:  Female: negative for dysuria or pelvic pain Musculoskeletal/Extremities:  no pain of the joints Integumentary (Skin/Breast):  no abnormal skin lesions reported Neurologic:  no headaches Endocrine:  denies fatigue Hematologic/Lymphatic:  No areas of easy bleeding  Exam BP 112/68 (BP Location: Left Arm, Patient Position: Sitting, Cuff Size: Normal)    Pulse 70    Temp (!) 95.4 F (35.2 C) (Temporal)    Ht 5' (1.524 m)    Wt 168 lb (76.2 kg)    SpO2 98%    BMI 32.81 kg/m  General:  well developed, well nourished, in no apparent distress Skin: macerated tissue between R foot digits 4/5; otherwise no lesions on feet; no significant moles, warts, or growths Head:  no masses, lesions, or tenderness Eyes:  pupils equal and round, sclera  anicteric without injection Ears:  canals without lesions, TMs shiny without retraction, no obvious effusion, no erythema Nose:  nares patent, septum midline, mucosa normal, and no drainage or sinus tenderness Throat/Pharynx:  lips and gingiva without lesion; tongue and uvula midline; non-inflamed pharynx; no exudates or postnasal drainage Neck: neck supple without adenopathy, thyromegaly, or masses Lungs:  clear to auscultation, breath sounds equal bilaterally, no respiratory distress Cardio:  regular rate and rhythm, no LE edema Abdomen:  abdomen soft, nontender; bowel sounds normal; no masses or organomegaly Genital: Defer to GYN Musculoskeletal:  symmetrical muscle groups noted without atrophy or deformity Extremities:  no clubbing, cyanosis, or edema, no deformities, no skin discoloration Neuro: sensation intact to pinprick b/l feet; gait normal; deep tendon reflexes normal and symmetric Psych: well oriented with normal range of affect and appropriate judgment/insight  Assessment and Plan  Well adult exam - Plan: CBC, Comprehensive metabolic panel, Lipid panel  Type 2 diabetes mellitus with hyperglycemia, without long-term current use of insulin (HCC) - Plan: Amb ref to Medical Nutrition Therapy-MNT, Hemoglobin A1c, Ambulatory referral to Ophthalmology  Encounter for screening mammogram for malignant neoplasm of breast - Plan: MM DIGITAL SCREENING BILATERAL  Screening for HIV (human immunodeficiency virus) - Plan: HIV Antibody (routine testing w rflx)  Encounter for hepatitis C screening test for low risk patient - Plan: Hepatitis C antibody  Localized swelling of both lower extremities - Plan: TSH, T4, free  Tinea pedis of right foot   Well 64 y.o. female. Counseled on diet and exercise. Other orders as above. Stop Norvasc. Cont other meds. Follow up in 1 mo to reck BP. Compression stockings, elevate legs, exercise, mind salt.  Cologard for CCS.  The patient and her husband  voiced understanding and agreement to the plan.  [place], DO 08/11/19 8:46 AM

## 2019-08-12 ENCOUNTER — Other Ambulatory Visit: Payer: Self-pay | Admitting: Family Medicine

## 2019-08-12 DIAGNOSIS — D649 Anemia, unspecified: Secondary | ICD-10-CM

## 2019-08-12 DIAGNOSIS — R7989 Other specified abnormal findings of blood chemistry: Secondary | ICD-10-CM

## 2019-08-12 LAB — HEPATITIS C ANTIBODY
Hepatitis C Ab: NONREACTIVE
SIGNAL TO CUT-OFF: 0.01 (ref <1.00)

## 2019-08-12 LAB — HIV ANTIBODY (ROUTINE TESTING W REFLEX): HIV 1&2 Ab, 4th Generation: NONREACTIVE

## 2019-08-17 ENCOUNTER — Telehealth: Payer: Self-pay | Admitting: Family Medicine

## 2019-08-17 MED ORDER — AMLODIPINE BESYLATE 5 MG PO TABS: 5.0000 mg | ORAL_TABLET | Freq: Every day | ORAL | 3 refills | Status: DC

## 2019-08-17 NOTE — Telephone Encounter (Signed)
OK to send in 5 mg/d tabs. Ty.

## 2019-08-17 NOTE — Telephone Encounter (Signed)
Called informed the patients husband--he stated previously she was taking amlodipine 5 mg bid?

## 2019-08-17 NOTE — Telephone Encounter (Signed)
Called and spoke to the husband and did inform of PCP response. Medication sent in.

## 2019-08-17 NOTE — Addendum Note (Signed)
Addended by: Scharlene Gloss B on: 08/17/2019 03:24 PM   Modules accepted: Orders

## 2019-08-17 NOTE — Telephone Encounter (Signed)
No, just 5 mg daily to start with, we can add something else, but I worry about swelling if we take it twice daily. Ty.

## 2019-08-17 NOTE — Telephone Encounter (Signed)
Caller: Sabahuddin (husband) Call back phone number:[phone]  Husband is calling stating patient blood pressure medicine was change (amlodipine) since then patient blood pressure is been high. The patient blood pressure was 08/15/19 168/90, 08/16/19 172/90 and 08/17/19 165/90. Patient would like to go back to amlodipine.  Please advise.

## 2019-08-21 ENCOUNTER — Ambulatory Visit (HOSPITAL_BASED_OUTPATIENT_CLINIC_OR_DEPARTMENT_OTHER)
Admission: RE | Admit: 2019-08-21 | Discharge: 2019-08-21 | Disposition: A | Discharge status: Home / Self Care | Payer: 59 | Source: Ambulatory Visit | Attending: Family Medicine | Admitting: Family Medicine

## 2019-08-21 ENCOUNTER — Other Ambulatory Visit: Payer: Self-pay

## 2019-08-21 DIAGNOSIS — Z1231 Encounter for screening mammogram for malignant neoplasm of breast: Secondary | ICD-10-CM | POA: Insufficient documentation

## 2019-09-07 ENCOUNTER — Telehealth: Payer: Self-pay | Admitting: Family Medicine

## 2019-09-07 NOTE — Telephone Encounter (Signed)
[facility]  Call Back # [phone]  Per Sue Lush, patient referred to [facility], patient missed appointment. Per Sue Lush, patient states not a diabetic.

## 2019-09-09 ENCOUNTER — Other Ambulatory Visit: Payer: Self-pay

## 2019-09-09 ENCOUNTER — Other Ambulatory Visit (INDEPENDENT_AMBULATORY_CARE_PROVIDER_SITE_OTHER): Payer: 59

## 2019-09-09 DIAGNOSIS — R7989 Other specified abnormal findings of blood chemistry: Secondary | ICD-10-CM

## 2019-09-09 DIAGNOSIS — D649 Anemia, unspecified: Secondary | ICD-10-CM

## 2019-09-09 LAB — CBC
HCT: 35.3 % — ABNORMAL LOW (ref 36.0–46.0)
Hemoglobin: 11.8 g/dL — ABNORMAL LOW (ref 12.0–15.0)
MCHC: 33.4 g/dL (ref 30.0–36.0)
MCV: 87.2 fl (ref 78.0–100.0)
Platelets: 153 10*3/uL (ref 150.0–400.0)
RBC: 4.05 Mil/uL (ref 3.87–5.11)
RDW: 12.6 % (ref 11.5–15.5)
WBC: 4.8 10*3/uL (ref 4.0–10.5)

## 2019-09-09 LAB — IBC + FERRITIN
Ferritin: 37.7 ng/mL (ref 10.0–291.0)
Iron: 74 ug/dL (ref 42–145)
Saturation Ratios: 20.9 % (ref 20.0–50.0)
Transferrin: 253 mg/dL (ref 212.0–360.0)

## 2019-09-09 LAB — T4, FREE: Free T4: 2.03 ng/dL — ABNORMAL HIGH (ref 0.60–1.60)

## 2019-09-09 LAB — TSH: TSH: 2.51 u[IU]/mL (ref 0.35–4.50)

## 2019-09-11 ENCOUNTER — Ambulatory Visit: Payer: 59 | Admitting: Family Medicine

## 2019-09-15 ENCOUNTER — Other Ambulatory Visit: Payer: Self-pay | Admitting: Family Medicine

## 2019-09-15 ENCOUNTER — Telehealth: Payer: Self-pay | Admitting: Family Medicine

## 2019-09-15 NOTE — Telephone Encounter (Signed)
Just sent in that refill

## 2019-09-15 NOTE — Telephone Encounter (Signed)
PATIENT IS ASKING FOR A REFILL ON MEDICATION [place]. I  dont see this medication on her med list . Please advise .

## 2019-09-23 ENCOUNTER — Other Ambulatory Visit: Payer: Self-pay | Admitting: Family Medicine

## 2019-10-01 ENCOUNTER — Telehealth: Payer: Self-pay | Admitting: Family Medicine

## 2019-10-01 NOTE — Telephone Encounter (Signed)
Patient called in regards to blood pressure being elevated  Pt would like PCP to send in another medication to help.   Please Advise

## 2019-10-02 ENCOUNTER — Telehealth: Payer: Self-pay

## 2019-10-02 NOTE — Telephone Encounter (Signed)
Nurse Assessment Nurse: Fransisco Hertz, RN, Elnita Maxwell Date/Time Elizabeth [name] Time): 10/01/2019 12:45:02 PM Confirm and document reason for call. If symptomatic, describe symptoms. ---Caller states his wife's blood pressure is running high, she is feeling more fatigue. Systolic has been running between 170-190 even with taking her Metroprolol as directed. Has the patient had close contact with a person known or suspected to have the novel coronavirus illness OR traveled / lives in area with major community spread (including international travel) in the last 14 days from the onset of symptoms? * If Asymptomatic, screen for exposure and travel within the last 14 days. ---No Does the patient have any new or worsening symptoms? ---Yes Will a triage be completed? ---Yes Related visit to physician within the last 2 weeks? ---No Does the PT have any chronic conditions? (i.e. diabetes, asthma, this includes High risk factors for pregnancy, etc.) ---Yes List chronic conditions. ---hypertension Is this a behavioral health or substance abuse call? ---No Guidelines Guideline Title Affirmed Question Affirmed Notes Nurse Date/Time (Eastern Time) Blood Pressure - High Systolic BP >= 180 OR Diastolic >= [address] RN, Elnita Maxwell 10/01/2019 12:46:41 PM Disp. Time (Eastern Time) Disposition Final UserPLEASE NOTE: All timestamps contained within this report are represented as [place] Standard Time. CONFIDENTIALTY NOTICE: This fax transmission is intended only for the addressee. It contains information that is legally privileged, confidential or otherwise protected from use or disclosure. If you are not the intended recipient, you are strictly prohibited from reviewing, disclosing, copying using or disseminating any of this information or taking any action in reliance on or regarding this information. If you have received this fax in error, please notify us immediately by telephone so that we can arrange for its return to  [place]. Phone: [phone], Toll-Free: [phone], Fax: [phone] Page: 2 of 2 Call Id: [id number] 10/01/2019 12:49:26 PM See PCP within 24 Hours Yes Fransisco Hertz, RN, Elnita Maxwell Caller Disagree/Comply Comply Caller Understands Yes PreDisposition Did not know what to do Care Advice Given Per Guideline SEE PCP WITHIN 24 HOURS: HIGH BLOOD PRESSURE: CALL BACK IF: * Weakness or numbness of the face, arm or leg on one side of the body occurs * Difficulty walking, difficulty talking, or severe headache occurs * Chest pain or difficulty breathing occurs * You become worse. Referrals REFERRED TO PCP OFFICE

## 2019-10-02 NOTE — Telephone Encounter (Signed)
Per Dr. Carmelia Roller, Pt needs to be seen. Noted from Mychart response.

## 2019-10-02 NOTE — Telephone Encounter (Signed)
Dr. Carmelia Roller responded via Mychart message

## 2019-10-21 ENCOUNTER — Other Ambulatory Visit: Payer: Self-pay | Admitting: Family Medicine

## 2019-10-22 NOTE — Progress Notes (Signed)
Cardiology Office Note   Date:  10/23/2019   ID:  Norman Bier, DOB 1955/12/15, MRN [id number]  PCP:  Shelda Pal, DO    No chief complaint on file.  HTN  Wt Readings from Last 3 Encounters:  10/23/19 169 lb 3.2 oz (76.7 kg)  08/11/19 168 lb (76.2 kg)  04/20/19 174 lb 12.8 oz (79.3 kg)       History of Present Illness: Elizabeth [name] is a 64 y.o. female  who had HTN. She describes herself as anxious and this makes the BP increase as well. She lived in [place] before coming to [place]. BP at home in [place] was 130/80. Once a month, she may have a spike as described. She reports headache with the high readings.   She had a kidney stone and had lithotripsy in [place]. She uses some cystone for her pain.  In the past, she has some elevated blood pressure readings in the evening. She also has had difficulty losing weight. She has occasional swelling in her legs.  She was prescribed a diuretic in 2019. Amlodipine was changed to twice daily to help with evening readings.  Due to insurance, they had to go to [facility] in 2020. Due to COVID, they did not see the cardiologist. THey switched insurance.  BP gets higher in the winter.  Recently, she has had spikes in her BP to 497W systolic.  Other readings have been better.  No low readings.   Denies : Chest pain. Dizziness.  Nitroglycerin use. Orthopnea. Palpitations. Paroxysmal nocturnal dyspnea. Shortness of breath. Syncope.    Past Medical History:  Diagnosis Date  . Hypertension     Past Surgical History:  Procedure Laterality Date  . CESAREAN SECTION       Current Outpatient Medications  Medication Sig Dispense Refill  . amLODipine (NORVASC) 10 MG tablet Take 10 mg by mouth daily.    Marland Kitchen atorvastatin (LIPITOR) 40 MG tablet Take 1 tablet (40 mg total) by mouth daily. 90 tablet 3  . blood glucose meter kit and supplies KIT daily as needed. Dispense based on patient and insurance preference. Use  up to once  daily as directed.    . carvedilol (COREG) 12.5 MG tablet Take 1 tablet (12.5 mg total) by mouth 2 (two) times daily with a meal. 180 tablet 3  . chlorthalidone (HYGROTON) 25 MG tablet TAKE 1 TABLET(25 MG) BY MOUTH DAILY 90 tablet 1  . cyclobenzaprine (FLEXERIL) 10 MG tablet Take 0.5-1 tablets (5-10 mg total) by mouth 3 (three) times daily as needed for muscle spasms. 21 tablet 0  . furosemide (LASIX) 20 MG tablet Take 1 tablet (20 mg total) by mouth daily as needed (swelling). 30 tablet 0  . glucose blood (IGLUCOSE TEST STRIPS) test strip Dispense based on patient and insurance preference. Test once daily to check blood sugar. 100 each 12  . guanFACINE (TENEX) 1 MG tablet TAKE 2 TABLETS(2 MG) BY MOUTH EVERY NIGHT 60 tablet 1  . Lancets MISC Dispense based on patient and insurance preference.  Test once daily to check blood sugar.    . losartan (COZAAR) 100 MG tablet Take 1 tablet (100 mg total) by mouth daily. 30 tablet 3  . metoprolol tartrate (LOPRESSOR) 50 MG tablet TAKE 1 TABLET BY MOUTH AS NEEDED IF BLOOD PRESSURE IS 150 TO 180 30 tablet 2  . RABEprazole (ACIPHEX) 20 MG tablet TAKE 1 TABLET(20 MG) BY MOUTH TWICE DAILY 60 tablet 2   No current facility-administered  medications for this visit.    Allergies:   Patient has no known allergies.    Social History:  The patient  reports that she has never smoked. She has never used smokeless tobacco. She reports that she does not drink alcohol and does not use drugs.   Family History:  The patient's family history includes Hypertension in her mother.    ROS:  Please see the history of present illness.   Otherwise, review of systems are positive for mild leg swelling.   All other systems are reviewed and negative.    PHYSICAL EXAM: VS:  BP 138/76   Pulse 69   Ht 5' (1.524 m)   Wt 169 lb 3.2 oz (76.7 kg)   SpO2 98%   BMI 33.04 kg/m  , BMI Body mass index is 33.04 kg/m. GEN: Well nourished, well developed, in no acute  distress  HEENT: normal  Neck: no JVD, carotid bruits, or masses Cardiac: RRR; no murmurs, rubs, or gallops,no edema  Respiratory:  clear to auscultation bilaterally, normal work of breathing GI: soft, nontender, nondistended, + BS MS: no deformity or atrophy  Skin: warm and dry, no rash Neuro:  Strength and sensation are intact Psych: euthymic mood, full affect    Recent Labs: 08/11/2019: ALT 12; BUN 21; Creatinine, Ser 0.83; Potassium 4.0; Sodium 139 09/09/2019: Hemoglobin 11.8; Platelets 153.0; TSH 2.51   Lipid Panel    Component Value Date/Time   CHOL 157 08/11/2019 0839   CHOL 160 04/20/2019 0928   TRIG 129.0 08/11/2019 0839   HDL 48.00 08/11/2019 0839   HDL 77 04/20/2019 0928   CHOLHDL 3 08/11/2019 0839   VLDL 25.8 08/11/2019 0839   LDLCALC 83 08/11/2019 0839   LDLCALC 73 04/20/2019 0928     Other studies Reviewed: Additional studies/ records that were reviewed today with results demonstrating: Labs reviewed.   ASSESSMENT AND PLAN:  1. HTN: Some spikes to the 195 mm Hg.  Better today. Increase Coreg to 12.5 mg BID.  Low salt diet. Avoid processed foods. Regular exercise. 2. Hyperlipidemia: Continue statin. Well controlled.  3. Leg edema: Mild.  Elevate legs.  Can use compression stockings as well.     Current medicines are reviewed at length with the patient today.  The patient concerns regarding her medicines were addressed.  The following changes have been made:  No change  Labs/ tests ordered today include:  No orders of the defined types were placed in this encounter.   Recommend 150 minutes/week of aerobic exercise Low fat, low carb, high fiber diet recommended  Disposition:   FU in 6 months, as scheduled   Signed, Larae Grooms, MD  10/23/2019 5:38 PM    [facility] [address], [place], [place]  [address] Phone: [phone]; Fax: [phone]

## 2019-10-23 ENCOUNTER — Other Ambulatory Visit: Payer: Self-pay

## 2019-10-23 ENCOUNTER — Encounter: Payer: Self-pay | Admitting: Interventional Cardiology

## 2019-10-23 ENCOUNTER — Ambulatory Visit (INDEPENDENT_AMBULATORY_CARE_PROVIDER_SITE_OTHER): Payer: 59 | Admitting: Interventional Cardiology

## 2019-10-23 VITALS — BP 138/76 | HR 69 | Ht 60.0 in | Wt 169.2 lb

## 2019-10-23 DIAGNOSIS — M25472 Effusion, left ankle: Secondary | ICD-10-CM | POA: Diagnosis not present

## 2019-10-23 DIAGNOSIS — E782 Mixed hyperlipidemia: Secondary | ICD-10-CM | POA: Diagnosis not present

## 2019-10-23 DIAGNOSIS — I10 Essential (primary) hypertension: Secondary | ICD-10-CM | POA: Diagnosis not present

## 2019-10-23 DIAGNOSIS — M25471 Effusion, right ankle: Secondary | ICD-10-CM | POA: Diagnosis not present

## 2019-10-23 MED ORDER — CARVEDILOL 12.5 MG PO TABS: 12.5000 mg | ORAL_TABLET | Freq: Two times a day (BID) | ORAL | 3 refills | Status: DC

## 2019-10-23 NOTE — Patient Instructions (Signed)
Medication Instructions:  Your physician has recommended you make the following change in your medication:   INCREASE: carvedilol to 12.5 MG by mouth twice a day  *If you need a refill on your cardiac medications before your next appointment, please call your pharmacy*   Lab Work: None  If you have labs (blood work) drawn today and your tests are completely normal, you will receive your results only by: Marland Kitchen MyChart Message (if you have MyChart) OR . A paper copy in the mail If you have any lab test that is abnormal or we need to change your treatment, we will call you to review the results.   Testing/Procedures: None   Follow-Up: Keep plan for follow up in February 2022   Other Instructions Your physician has requested that you regularly monitor and record your blood pressure readings at home. Please use the same machine at the same time of day to check your readings and record them and send MyChart message with readings.   Low-Sodium Eating Plan Sodium, which is an element that makes up salt, helps you maintain a healthy balance of fluids in your body. Too much sodium can increase your blood pressure and cause fluid and waste to be held in your body. Your health care provider or dietitian may recommend following this plan if you have high blood pressure (hypertension), kidney disease, liver disease, or heart failure. Eating less sodium can help lower your blood pressure, reduce swelling, and protect your heart, liver, and kidneys. What are tips for following this plan? General guidelines  Most people on this plan should limit their sodium intake to 1,500-2,000 mg (milligrams) of sodium each day. Reading food labels   The Nutrition Facts label lists the amount of sodium in one serving of the food. If you eat more than one serving, you must multiply the listed amount of sodium by the number of servings.  Choose foods with less than 140 mg of sodium per serving.  Avoid foods  with 300 mg of sodium or more per serving. Shopping  Look for lower-sodium products, often labeled as "low-sodium" or "no salt added."  Always check the sodium content even if foods are labeled as "unsalted" or "no salt added".  Buy fresh foods. ? Avoid canned foods and premade or frozen meals. ? Avoid canned, cured, or processed meats  Buy breads that have less than 80 mg of sodium per slice. Cooking  Eat more home-cooked food and less restaurant, buffet, and fast food.  Avoid adding salt when cooking. Use salt-free seasonings or herbs instead of table salt or sea salt. Check with your health care provider or pharmacist before using salt substitutes.  Cook with plant-based oils, such as canola, sunflower, or olive oil. Meal planning  When eating at a restaurant, ask that your food be prepared with less salt or no salt, if possible.  Avoid foods that contain MSG (monosodium glutamate). MSG is sometimes added to [place] food, bouillon, and some canned foods. What foods are recommended? The items listed may not be a complete list. Talk with your dietitian about what dietary choices are best for you. Grains Low-sodium cereals, including oats, puffed wheat and rice, and shredded wheat. Low-sodium crackers. Unsalted rice. Unsalted pasta. Low-sodium bread. Whole-grain breads and whole-grain pasta. Vegetables Fresh or frozen vegetables. "No salt added" canned vegetables. "No salt added" tomato sauce and paste. Low-sodium or reduced-sodium tomato and vegetable juice. Fruits Fresh, frozen, or canned fruit. Fruit juice. Meats and other protein foods Fresh or  frozen (no salt added) meat, poultry, seafood, and fish. Low-sodium canned tuna and salmon. Unsalted nuts. Dried peas, beans, and lentils without added salt. Unsalted canned beans. Eggs. Unsalted nut butters. Dairy Milk. Soy milk. Cheese that is naturally low in sodium, such as ricotta cheese, fresh mozzarella, or Swiss cheese Low-sodium  or reduced-sodium cheese. Cream cheese. Yogurt. Fats and oils Unsalted butter. Unsalted margarine with no trans fat. Vegetable oils such as canola or olive oils. Seasonings and other foods Fresh and dried herbs and spices. Salt-free seasonings. Low-sodium mustard and ketchup. Sodium-free salad dressing. Sodium-free light mayonnaise. Fresh or refrigerated horseradish. Lemon juice. Vinegar. Homemade, reduced-sodium, or low-sodium soups. Unsalted popcorn and pretzels. Low-salt or salt-free chips. What foods are not recommended? The items listed may not be a complete list. Talk with your dietitian about what dietary choices are best for you. Grains Instant hot cereals. Bread stuffing, pancake, and biscuit mixes. Croutons. Seasoned rice or pasta mixes. Noodle soup cups. Boxed or frozen macaroni and cheese. Regular salted crackers. Self-rising flour. Vegetables Sauerkraut, pickled vegetables, and relishes. Olives. [place] fries. Onion rings. Regular canned vegetables (not low-sodium or reduced-sodium). Regular canned tomato sauce and paste (not low-sodium or reduced-sodium). Regular tomato and vegetable juice (not low-sodium or reduced-sodium). Frozen vegetables in sauces. Meats and other protein foods Meat or fish that is salted, canned, smoked, spiced, or pickled. Bacon, ham, sausage, hotdogs, corned beef, chipped beef, packaged lunch meats, salt pork, jerky, pickled herring, anchovies, regular canned tuna, sardines, salted nuts. Dairy Processed cheese and cheese spreads. Cheese curds. Blue cheese. Feta cheese. String cheese. Regular cottage cheese. Buttermilk. Canned milk. Fats and oils Salted butter. Regular margarine. Ghee. Bacon fat. Seasonings and other foods Onion salt, garlic salt, seasoned salt, table salt, and sea salt. Canned and packaged gravies. Worcestershire sauce. Tartar sauce. Barbecue sauce. Teriyaki sauce. Soy sauce, including reduced-sodium. Steak sauce. Fish sauce. Oyster sauce.  Cocktail sauce. Horseradish that you find on the shelf. Regular ketchup and mustard. Meat flavorings and tenderizers. Bouillon cubes. Hot sauce and Tabasco sauce. Premade or packaged marinades. Premade or packaged taco seasonings. Relishes. Regular salad dressings. Salsa. Potato and tortilla chips. Corn chips and puffs. Salted popcorn and pretzels. Canned or dried soups. Pizza. Frozen entrees and pot pies. Summary  Eating less sodium can help lower your blood pressure, reduce swelling, and protect your heart, liver, and kidneys.  Most people on this plan should limit their sodium intake to 1,500-2,000 mg (milligrams) of sodium each day.  Canned, boxed, and frozen foods are high in sodium. Restaurant foods, fast foods, and pizza are also very high in sodium. You also get sodium by adding salt to food.  Try to cook at home, eat more fresh fruits and vegetables, and eat less fast food, canned, processed, or prepared foods. This information is not intended to replace advice given to you by your health care provider. Make sure you discuss any questions you have with your health care provider. Document Revised: 02/08/2017 Document Reviewed: 02/20/2016 Elsevier Patient Education  2020 [employer].

## 2019-10-28 ENCOUNTER — Other Ambulatory Visit: Payer: Self-pay

## 2019-10-28 ENCOUNTER — Encounter: Payer: Self-pay | Admitting: Family Medicine

## 2019-10-28 ENCOUNTER — Ambulatory Visit (INDEPENDENT_AMBULATORY_CARE_PROVIDER_SITE_OTHER): Payer: 59 | Admitting: Family Medicine

## 2019-10-28 VITALS — BP 130/72 | HR 61 | Temp 98.4°F | Ht 60.0 in | Wt 169.2 lb

## 2019-10-28 DIAGNOSIS — M7989 Other specified soft tissue disorders: Secondary | ICD-10-CM | POA: Diagnosis not present

## 2019-10-28 DIAGNOSIS — I10 Essential (primary) hypertension: Secondary | ICD-10-CM

## 2019-10-28 MED ORDER — FUROSEMIDE 40 MG PO TABS: 40.0000 mg | ORAL_TABLET | Freq: Every day | ORAL | 3 refills | Status: DC

## 2019-10-28 MED ORDER — POTASSIUM CHLORIDE CRYS ER 10 MEQ PO TBCR: EXTENDED_RELEASE_TABLET | ORAL | 2 refills | Status: DC

## 2019-10-28 NOTE — Patient Instructions (Signed)
For the swelling in your lower extremities, be sure to elevate your legs when able, mind the salt intake, stay physically active and consider wearing compression stockings.  Keep checking your blood pressure. Bring your monitor to your next appointment.   Keep the diet clean and stay active.  Let us know if you need anything.

## 2019-10-28 NOTE — Progress Notes (Signed)
Chief Complaint  Patient presents with  . Follow-up  . Edema    Subjective Elizabeth [name] is a 64 y.o. female who presents for hypertension follow up. Here w husband, Elizabeth [name] who helps interpret.  She does monitor home blood pressures. Blood pressures ranging from 130-140's/60-70's on average. She is compliant with medications- Norvasc 10 mg/d, Coreg 12.5 mg bid, Chlorthalidone 25 mg/d, losartan 100 mg/d.  Patient has these side effects of medication: none She is adhering to a healthy diet overall. Current exercise: walking    Past Medical History:  Diagnosis Date  . Hypertension     Exam BP 130/72 (BP Location: Left Arm, Patient Position: Sitting, Cuff Size: Normal)   Pulse 61   Temp 98.4 F (36.9 C) (Oral)   Ht 5' (1.524 m)   Wt 169 lb 4 oz (76.8 kg)   SpO2 98%   BMI 33.05 kg/m  General:  well developed, well nourished, in no apparent distress Heart: RRR, no bruits, 2+ LE edema b/l, tapering at prox 1/3 of tibia, not above knees Lungs: clear to auscultation, no accessory muscle use Psych: well oriented with normal range of affect and appropriate judgment/insight  Essential hypertension  Swelling of both lower extremities - Plan: furosemide (LASIX) 40 MG tablet, potassium chloride (KLOR-CON) 10 MEQ tablet  1. Cont home meds. Discussed halving her Norvasc again and increasing Coreg to 25 mg/d. We elected to hold off on this. Cont monitoring home BP. Bring monitor to next visit. 2. Opted to trial Lasix, will add K. Elevate legs, mind salt, compression stockings rec'd.  F/u in 1 week for labs, 1 mo to reck BP w me. The patient and her spouse voiced understanding and agreement to the plan.  Jilda Roche [place], DO 10/28/19  8:14 AM

## 2019-10-29 ENCOUNTER — Telehealth: Payer: Self-pay | Admitting: *Deleted

## 2019-10-29 DIAGNOSIS — I10 Essential (primary) hypertension: Secondary | ICD-10-CM

## 2019-10-29 NOTE — Telephone Encounter (Signed)
Done, ty

## 2019-10-29 NOTE — Telephone Encounter (Signed)
Pt has lab appointment on 11/09/19 but I do not see any future orders in Epic.  Can you place orders if appropriate?

## 2019-11-09 ENCOUNTER — Other Ambulatory Visit: Payer: 59

## 2019-11-09 ENCOUNTER — Other Ambulatory Visit: Payer: Self-pay | Admitting: Family Medicine

## 2019-11-23 ENCOUNTER — Ambulatory Visit: Payer: 59 | Admitting: Family Medicine

## 2019-12-19 ENCOUNTER — Other Ambulatory Visit: Payer: Self-pay | Admitting: Family Medicine

## 2019-12-24 ENCOUNTER — Other Ambulatory Visit: Payer: Self-pay | Admitting: Family Medicine

## 2020-01-02 ENCOUNTER — Other Ambulatory Visit: Payer: Self-pay | Admitting: Family Medicine

## 2020-01-02 DIAGNOSIS — M7989 Other specified soft tissue disorders: Secondary | ICD-10-CM

## 2020-01-20 ENCOUNTER — Ambulatory Visit (INDEPENDENT_AMBULATORY_CARE_PROVIDER_SITE_OTHER): Payer: 59 | Admitting: Family Medicine

## 2020-01-20 ENCOUNTER — Encounter: Payer: Self-pay | Admitting: Family Medicine

## 2020-01-20 ENCOUNTER — Other Ambulatory Visit: Payer: Self-pay

## 2020-01-20 VITALS — BP 130/78 | HR 61 | Temp 97.9°F | Ht 60.0 in | Wt 165.5 lb

## 2020-01-20 DIAGNOSIS — I10 Essential (primary) hypertension: Secondary | ICD-10-CM | POA: Diagnosis not present

## 2020-01-20 DIAGNOSIS — Z23 Encounter for immunization: Secondary | ICD-10-CM | POA: Diagnosis not present

## 2020-01-20 DIAGNOSIS — R0683 Snoring: Secondary | ICD-10-CM | POA: Diagnosis not present

## 2020-01-20 DIAGNOSIS — R7989 Other specified abnormal findings of blood chemistry: Secondary | ICD-10-CM

## 2020-01-20 LAB — T4, FREE: Free T4: 1.38 ng/dL (ref 0.60–1.60)

## 2020-01-20 LAB — TSH: TSH: 1.29 u[IU]/mL (ref 0.35–4.50)

## 2020-01-20 MED ORDER — SPIRONOLACTONE 25 MG PO TABS: 25.0000 mg | ORAL_TABLET | Freq: Every day | ORAL | 3 refills | Status: DC

## 2020-01-20 NOTE — Patient Instructions (Signed)
Give [place] 2-3 business days to get the results of your labs back.   Keep the diet clean and stay active.  Keep checking your blood pressure.   Take the spironolactone in the morning. Continue everything else.  If you do not hear anything about your referral in the next 1-2 weeks, call our office and ask for an update.  Let us know if you need anything.

## 2020-01-20 NOTE — Progress Notes (Signed)
Chief Complaint  Patient presents with  . Hypertension    Subjective Elizabeth [name] is a 64 y.o. female who presents for hypertension follow up. Here w husband who helps interpret.  She does monitor home blood pressures. Blood pressures ranging from 120's/70's on average. She is compliant with medications. Patient has these side effects of medication: none She is adhering to a healthy diet overall. Current exercise: walking She snores and wakes up feeling tired. She has never had a sleep study.  Her T4 was elevated. I sent her a message, but did not get a response. We will check again today. She does not follow w endo.    Past Medical History:  Diagnosis Date  . Hypertension     Exam BP 130/78 (BP Location: Left Arm, Patient Position: Sitting, Cuff Size: Normal)   Pulse 61   Temp 97.9 F (36.6 C) (Oral)   Ht 5' (1.524 m)   Wt 165 lb 8 oz (75.1 kg)   SpO2 97%   BMI 32.32 kg/m  General:  well developed, well nourished, in no apparent distress Heart: RRR, no bruits, no LE edema Lungs: clear to auscultation, no accessory muscle use Psych: well oriented with normal range of affect and appropriate judgment/insight  Resistant hypertension - Plan: T4, free, TSH, Aldosterone + renin activity w/ ratio, Ambulatory referral to Pulmonology, Basic Metabolic Panel (BMET)  Snoring - Plan: Ambulatory referral to Pulmonology  Elevated serum free T4 level  Need for influenza vaccination - Plan: Flu Vaccine QUAD 6+ mos PF IM (Fluarix Quad PF)  1. Cont Norvasc 5 mg bid, losartan 100 mg in AM, chlorthalidone 25 mg qhs, Coreg 12.5 mg bid. Start spironolactone 25 mg/d in AM. monitor BP's at home. Counseled on diet and exercise. Ck other labs.  2. OSA could be contributing, will refer to pulm. 3. Ck labs, if elevated will refer to endo and start methimazole.  F/u in 1 week for labs, 1 mo to reck BP w me. The patient and her husband voiced understanding and agreement to the plan.  Jilda Roche [place], DO 01/20/20  8:21 AM

## 2020-01-27 ENCOUNTER — Other Ambulatory Visit (INDEPENDENT_AMBULATORY_CARE_PROVIDER_SITE_OTHER): Payer: 59

## 2020-01-27 ENCOUNTER — Telehealth: Payer: Self-pay | Admitting: Family Medicine

## 2020-01-27 ENCOUNTER — Other Ambulatory Visit: Payer: Self-pay

## 2020-01-27 DIAGNOSIS — I10 Essential (primary) hypertension: Secondary | ICD-10-CM | POA: Diagnosis not present

## 2020-01-27 LAB — BASIC METABOLIC PANEL
BUN: 16 mg/dL (ref 6–23)
CO2: 30 mEq/L (ref 19–32)
Calcium: 9.1 mg/dL (ref 8.4–10.5)
Chloride: 101 mEq/L (ref 96–112)
Creatinine, Ser: 0.83 mg/dL (ref 0.40–1.20)
GFR: 74.87 mL/min (ref >60.00)
Glucose, Bld: 149 mg/dL — ABNORMAL HIGH (ref 70–99)
Potassium: 4.2 mEq/L (ref 3.5–5.1)
Sodium: 137 mEq/L (ref 135–145)

## 2020-01-27 NOTE — Telephone Encounter (Signed)
Pt dropped off document to be filled out by provider (1 page [facility] Health Report) Pt would like to be called when ready at [phone]. Document put at front office tray under providers name.

## 2020-01-27 NOTE — Telephone Encounter (Signed)
PCP completed. Called the patient informed her husband form done. They will pickup from the front desk tomorrow 01/28/2020.

## 2020-01-29 LAB — ALDOSTERONE + RENIN ACTIVITY W/ RATIO
ALDO / PRA Ratio: 1.7 Ratio (ref 0.9–28.9)
Aldosterone: 3 ng/dL
Renin Activity: 1.72 ng/mL/h (ref 0.25–5.82)

## 2020-02-17 ENCOUNTER — Encounter: Payer: Self-pay | Admitting: Family Medicine

## 2020-02-17 ENCOUNTER — Other Ambulatory Visit: Payer: Self-pay

## 2020-02-17 ENCOUNTER — Ambulatory Visit (INDEPENDENT_AMBULATORY_CARE_PROVIDER_SITE_OTHER): Payer: 59 | Admitting: Family Medicine

## 2020-02-17 VITALS — BP 122/68 | HR 59 | Temp 98.1°F | Ht 60.0 in | Wt 167.2 lb

## 2020-02-17 DIAGNOSIS — E1169 Type 2 diabetes mellitus with other specified complication: Secondary | ICD-10-CM | POA: Diagnosis not present

## 2020-02-17 DIAGNOSIS — I10 Essential (primary) hypertension: Secondary | ICD-10-CM

## 2020-02-17 DIAGNOSIS — E669 Obesity, unspecified: Secondary | ICD-10-CM | POA: Diagnosis not present

## 2020-02-17 NOTE — Progress Notes (Signed)
Chief Complaint  Patient presents with  . Follow-up    blood pressure    Subjective Elizabeth [name] is a 64 y.o. female who presents for hypertension follow up. Here w husband, Elizabeth [name].  She does monitor home blood pressures. Blood pressures ranging from 120-140's/80's on average. She is compliant with medications-Norvasc 5 mg bid, Coreg 12.5 mg bid, chlorthalidone 25 mg/d, losartan 100 mg/d, spironolactone 25 mg/d. Patient has these side effects of medication: none She is adhering to a healthy diet overall. Current exercise: walking No CP or SOB.   Past Medical History:  Diagnosis Date  . Hypertension    Exam BP 122/68 (BP Location: Left Arm, Patient Position: Sitting, Cuff Size: Normal)   Pulse (!) 59   Temp 98.1 F (36.7 C) (Oral)   Ht 5' (1.524 m)   Wt 167 lb 4 oz (75.9 kg)   SpO2 98%   BMI 32.66 kg/m  General:  well developed, well nourished, in no apparent distress Heart: RRR, no bruits, no LE edema Lungs: clear to auscultation, no accessory muscle use Psych: well oriented with normal range of affect and appropriate judgment/insight  Resistant hypertension  I think she is finally controlled with spironolactone 25 mg/d, Norvasc 5 mg bid, Coreg 12.5 mg bid, losartan 100 mg/d. Goal BP is <150/90 consistently which she is at. She will cont to monitor BP at home. Counseled on diet and exercise. F/u in 3 mo to review sugars/BP. The patient and her spouse voiced understanding and agreement to the plan.  Elizabeth [name] [place], DO 02/17/20  7:49 AM

## 2020-02-17 NOTE — Patient Instructions (Addendum)
Keep the diet clean and stay active.  Keep checking your blood pressure at home.  No medication changes today.  When you check your blood pressure in the late afternoon, check a few minutes later to verify a higher reading.   Let us know if you need anything.

## 2020-02-22 LAB — COLOGUARD: Cologuard: NEGATIVE

## 2020-02-27 ENCOUNTER — Encounter: Payer: Self-pay | Admitting: Family Medicine

## 2020-02-27 LAB — COLOGUARD: COLOGUARD: NEGATIVE

## 2020-02-29 ENCOUNTER — Telehealth: Payer: Self-pay | Admitting: Family Medicine

## 2020-02-29 NOTE — Telephone Encounter (Signed)
cologuard results received and were NEGATIVE Updated health maintenance. Patient informed of result

## 2020-03-27 ENCOUNTER — Other Ambulatory Visit: Payer: Self-pay | Admitting: Family Medicine

## 2020-04-04 ENCOUNTER — Other Ambulatory Visit: Payer: Self-pay | Admitting: Family Medicine

## 2020-04-04 DIAGNOSIS — M7989 Other specified soft tissue disorders: Secondary | ICD-10-CM

## 2020-04-10 ENCOUNTER — Other Ambulatory Visit: Payer: Self-pay | Admitting: Family Medicine

## 2020-05-04 ENCOUNTER — Encounter: Payer: Self-pay | Admitting: Family Medicine

## 2020-05-04 ENCOUNTER — Other Ambulatory Visit: Payer: Self-pay

## 2020-05-04 ENCOUNTER — Ambulatory Visit (INDEPENDENT_AMBULATORY_CARE_PROVIDER_SITE_OTHER): Payer: 59 | Admitting: Family Medicine

## 2020-05-04 VITALS — BP 130/80 | HR 55 | Temp 97.7°F | Ht 60.0 in | Wt 166.5 lb

## 2020-05-04 DIAGNOSIS — E669 Obesity, unspecified: Secondary | ICD-10-CM | POA: Diagnosis not present

## 2020-05-04 DIAGNOSIS — E1169 Type 2 diabetes mellitus with other specified complication: Secondary | ICD-10-CM | POA: Diagnosis not present

## 2020-05-04 DIAGNOSIS — I10 Essential (primary) hypertension: Secondary | ICD-10-CM | POA: Diagnosis not present

## 2020-05-04 LAB — MICROALBUMIN / CREATININE URINE RATIO
Creatinine,U: 29.7 mg/dL
Microalb Creat Ratio: 2.4 mg/g (ref 0.0–30.0)
Microalb, Ur: <0.7 mg/dL (ref 0.0–1.9)

## 2020-05-04 LAB — COMPREHENSIVE METABOLIC PANEL
ALT: 11 U/L (ref 0–35)
AST: 11 U/L (ref 0–37)
Albumin: 4 g/dL (ref 3.5–5.2)
Alkaline Phosphatase: 56 U/L (ref 39–117)
BUN: 21 mg/dL (ref 6–23)
CO2: 31 mEq/L (ref 19–32)
Calcium: 9.5 mg/dL (ref 8.4–10.5)
Chloride: 103 mEq/L (ref 96–112)
Creatinine, Ser: 0.92 mg/dL (ref 0.40–1.20)
GFR: 66.05 mL/min (ref >60.00)
Glucose, Bld: 142 mg/dL — ABNORMAL HIGH (ref 70–99)
Potassium: 4.3 mEq/L (ref 3.5–5.1)
Sodium: 139 mEq/L (ref 135–145)
Total Bilirubin: 0.5 mg/dL (ref 0.2–1.2)
Total Protein: 6.9 g/dL (ref 6.0–8.3)

## 2020-05-04 LAB — LIPID PANEL
Cholesterol: 161 mg/dL (ref 0–200)
HDL: 64.2 mg/dL (ref >39.00)
LDL Cholesterol: 85 mg/dL (ref 0–99)
NonHDL: 96.85
Total CHOL/HDL Ratio: 3
Triglycerides: 57 mg/dL (ref 0.0–149.0)
VLDL: 11.4 mg/dL (ref 0.0–40.0)

## 2020-05-04 LAB — URIC ACID: Uric Acid, Serum: 4.9 mg/dL (ref 2.4–7.0)

## 2020-05-04 LAB — HEMOGLOBIN A1C: Hgb A1c MFr Bld: 6.9 % — ABNORMAL HIGH (ref 4.6–6.5)

## 2020-05-04 MED ORDER — CARVEDILOL 25 MG PO TABS: 25.0000 mg | ORAL_TABLET | Freq: Two times a day (BID) | ORAL | 3 refills | Status: DC

## 2020-05-04 NOTE — Progress Notes (Signed)
Chief Complaint  Patient presents with  . Follow-up    Subjective Elizabeth [name] is a 65 y.o. female who presents for hypertension follow up. She does monitor home blood pressures. Blood pressures ranging from 150's/70's on average. She is compliant with medications. Patient has these side effects of medication: none She is adhering to a healthy diet overall. Current exercise: walking  No CP or SOB.   Patient has a history of diabetes and is currently diet controlled.  Diet and exercise as above.  She is on a statin.  She does not have an eye provider.   Past Medical History:  Diagnosis Date  . Diabetes mellitus type 2 in obese (HCC)   . Hypertension     Exam BP 130/80 (BP Location: Left Arm, Patient Position: Sitting, Cuff Size: Normal)   Pulse (!) 55   Temp 97.7 F (36.5 C) (Oral)   Ht 5' (1.524 m)   Wt 166 lb 8 oz (75.5 kg)   SpO2 98%   BMI 32.52 kg/m  General:  well developed, well nourished, in no apparent distress Heart: RRR, no bruits, no LE edema Lungs: clear to auscultation, no accessory muscle use Psych: well oriented with normal range of affect and appropriate judgment/insight  Resistant hypertension - Plan: Ambulatory referral to Pulmonology, carvedilol (COREG) 25 MG tablet  Diabetes mellitus type 2 in obese (HCC) - Plan: Comprehensive metabolic panel, Hemoglobin A1c, Lipid panel, Microalbumin / creatinine urine ratio, Uric acid, Ambulatory referral to Ophthalmology  1.  She did not go to the sleep specialty appointment.  Refer her again.  Increase carvedilol from 12.5 mg twice daily to 25 mg twice daily.  Continue other regimen as above.  Could consider hydralazine in the future.  Also could consider clonidine.  Continue to monitor blood pressure at home.  I do believe sleep apnea is playing an underlying role.  Counseled on diet and exercise. 2.  She is controlled regarding this.  We will check labs.  She needs to see an eye provider we will facilitate by  starting in the referral process. F/u in 1 month recheck #1. The patient voiced understanding and agreement to the plan.  Jilda Roche [place], DO 05/04/20  10:55 AM

## 2020-05-04 NOTE — Patient Instructions (Signed)
Give [place] 2-3 business days to get the results of your labs back.   Keep the diet clean and stay active.  Check your blood pressures 2-3 times per week, alternating the time of day you check it. If it is high, considering waiting 1-2 minutes and rechecking. If it gets higher, your anxiety is likely creeping up and we should avoid rechecking.   If you do not hear anything about your referral in the next 1-2 weeks, call our office and ask for an update.  Let us know if you need anything.

## 2020-05-18 ENCOUNTER — Other Ambulatory Visit: Payer: Self-pay | Admitting: Family Medicine

## 2020-06-01 ENCOUNTER — Ambulatory Visit: Payer: 59 | Admitting: Family Medicine

## 2020-06-08 ENCOUNTER — Other Ambulatory Visit: Payer: Self-pay | Admitting: Family Medicine

## 2020-06-13 ENCOUNTER — Other Ambulatory Visit: Payer: Self-pay | Admitting: Family Medicine

## 2020-06-13 ENCOUNTER — Telehealth: Payer: Self-pay | Admitting: *Deleted

## 2020-06-13 ENCOUNTER — Encounter: Payer: Self-pay | Admitting: Family Medicine

## 2020-06-13 ENCOUNTER — Ambulatory Visit (INDEPENDENT_AMBULATORY_CARE_PROVIDER_SITE_OTHER): Payer: 59 | Admitting: Family Medicine

## 2020-06-13 ENCOUNTER — Other Ambulatory Visit: Payer: Self-pay

## 2020-06-13 ENCOUNTER — Other Ambulatory Visit (HOSPITAL_BASED_OUTPATIENT_CLINIC_OR_DEPARTMENT_OTHER): Payer: Self-pay

## 2020-06-13 VITALS — BP 146/88 | HR 68 | Temp 98.5°F

## 2020-06-13 DIAGNOSIS — I10 Essential (primary) hypertension: Secondary | ICD-10-CM

## 2020-06-13 DIAGNOSIS — R519 Headache, unspecified: Secondary | ICD-10-CM

## 2020-06-13 DIAGNOSIS — R110 Nausea: Secondary | ICD-10-CM | POA: Diagnosis not present

## 2020-06-13 MED ORDER — ONDANSETRON 4 MG PO TBDP: 4.0000 mg | ORAL_TABLET | Freq: Three times a day (TID) | ORAL | 0 refills | Status: DC | PRN

## 2020-06-13 MED ORDER — AMITRIPTYLINE HCL 25 MG PO TABS
25.0000 mg | ORAL_TABLET | Freq: Every day | ORAL | 0 refills | Status: DC
  Filled 2020-06-13 (×2): qty 30, 30d supply, fill #0

## 2020-06-13 MED ORDER — METHYLPREDNISOLONE ACETATE 80 MG/ML IJ SUSP
80.0000 mg | Freq: Once | INTRAMUSCULAR | Status: AC
  Administered 2020-06-13: 80 mg via INTRAMUSCULAR

## 2020-06-13 MED ORDER — AMITRIPTYLINE HCL 25 MG PO TABS: 25.0000 mg | ORAL_TABLET | Freq: Every day | ORAL | 0 refills | Status: DC

## 2020-06-13 MED ORDER — ONDANSETRON 4 MG PO TBDP
4.0000 mg | ORAL_TABLET | Freq: Three times a day (TID) | ORAL | 0 refills | Status: DC | PRN
  Filled 2020-06-13: qty 20, 7d supply, fill #0
  Filled 2020-06-13: qty 18, 21d supply, fill #0

## 2020-06-13 NOTE — Patient Instructions (Addendum)
Stay hydrated.   If things get worse, go to the ER.   I want to see you tomorrow or Wednesday if you aren't getting better.   Continue to monitor blood pressures.  Let us know if you need anything.

## 2020-06-13 NOTE — Telephone Encounter (Signed)
Call Type Triage / Clinical Caller Name Sabahuddin Mujahid Relationship To Patient Spouse Return Phone Number [phone] (Primary) Chief Complaint Headache Reason for Call Symptomatic / Request for Health Information Initial Comment Caller's wife is having a hard time sleeping and has high blood pressure with a headache. The last blood pressure is 165/85. Translation No Disp. Time Lamount Cohen Time) Disposition Final User 06/13/2020 8:02:01 AM Attempt made - no message left Self, RN, Herbert Seta 06/13/2020 8:19:55 AM Attempt made - no message left Self, RN, Herbert Seta 06/13/2020 8:33:44 AM FINAL ATTEMPT MADE - no message left

## 2020-06-13 NOTE — Progress Notes (Signed)
Chief Complaint  Patient presents with  . Headache    Nausea      Elizabeth [name] is a 65 y.o. female here for evaluation of an acute headache. Here w husband who helps w hx.   Duration: 3 days Laterality: bilateral Quality: aching Severity: "very severe" Associated symptoms: N/V, heart burn, diffuse weakness, has not been able to sleep Denies: Fevers, neck pain, jaw pain, sinus pain, focal weakness, injury Therapies tried: Tylenol Hx of migraines: no.   Hypertension Patient presents for hypertension follow up. She does monitor home blood pressures. Blood pressures ranging on average from 160-170's/90's in the evenings. She is compliant with medications-Norvasc 5 mg twice daily, chlorthalidone 25 mg daily, spironolactone 25 mg daily, carvedilol 25 mg 2x daily, losartan 100 mg daily. Patient has these side effects of medication: none She is adhering to a healthy diet overall. Exercise: Walking No chest pain or shortness of breath  Past Medical History:  Diagnosis Date  . Diabetes mellitus type 2 in obese (HCC)   . Hypertension     BP (!) 146/88 (BP Location: Left Arm, Patient Position: Sitting, Cuff Size: Normal)   Pulse 68   Temp 98.5 F (36.9 C) (Oral)   SpO2 99%  Gen: awake, alert, appearing stated age Eyes: PERRLA, EOMi, no injection Heart: RRR Lungs: CTAB, no accessory muscle use Abd: BS+, soft, NT, ND Neuro: CN2-12 grossly intact, fluent and goal-oriented speech, DTR's equal and symmetric in UE's and LE's MSK: 5/5 strength throughout, no TTP over cervical paraspinal musculature or occipital triangle region Psych: Age appropriate judgment and insight, normal affect and mood  Acute intractable headache, unspecified headache type - Plan: amitriptyline (ELAVIL) 25 MG tablet, methylPREDNISolone acetate (DEPO-MEDROL) injection 80 mg  Nausea - Plan: ondansetron (ZOFRAN-ODT) 4 MG disintegrating tablet  Essential hypertension, benign  1.  Elavil to help with sleep  and possibly if she is having migraines as a preventative.  Depo injection today.  Neuro exam unremarkable. 2.  Zofran as needed. 3.  Opted to improve #1 and 2 above.  Continue spironolactone 25 mg daily, carvedilol 25 mg twice daily, losartan 100 mg daily, and Norvasc 5 mg twice daily for now.  Continue to monitor blood pressure at home. F/u in 1 to 2 days if no improvement.  Will get imaging at that time. The pt and her spouse voiced understanding and agreement to the plan.  Jilda Roche [place], DO 06/13/20 11:58 AM

## 2020-06-13 NOTE — Telephone Encounter (Signed)
Patient already seen Pcp today for this.

## 2020-06-14 ENCOUNTER — Ambulatory Visit (INDEPENDENT_AMBULATORY_CARE_PROVIDER_SITE_OTHER): Payer: 59 | Admitting: Family Medicine

## 2020-06-14 ENCOUNTER — Other Ambulatory Visit: Payer: Self-pay | Admitting: Family Medicine

## 2020-06-14 ENCOUNTER — Encounter: Payer: Self-pay | Admitting: Family Medicine

## 2020-06-14 ENCOUNTER — Ambulatory Visit (HOSPITAL_BASED_OUTPATIENT_CLINIC_OR_DEPARTMENT_OTHER)
Admission: RE | Admit: 2020-06-14 | Discharge: 2020-06-14 | Disposition: A | Discharge status: Home / Self Care | Payer: 59 | Source: Ambulatory Visit | Attending: Family Medicine | Admitting: Family Medicine

## 2020-06-14 ENCOUNTER — Other Ambulatory Visit (HOSPITAL_BASED_OUTPATIENT_CLINIC_OR_DEPARTMENT_OTHER): Payer: Self-pay

## 2020-06-14 VITALS — BP 112/72 | HR 70 | Temp 98.6°F

## 2020-06-14 DIAGNOSIS — R519 Headache, unspecified: Secondary | ICD-10-CM | POA: Diagnosis not present

## 2020-06-14 DIAGNOSIS — R42 Dizziness and giddiness: Secondary | ICD-10-CM | POA: Diagnosis not present

## 2020-06-14 DIAGNOSIS — K219 Gastro-esophageal reflux disease without esophagitis: Secondary | ICD-10-CM | POA: Diagnosis not present

## 2020-06-14 DIAGNOSIS — H814 Vertigo of central origin: Secondary | ICD-10-CM | POA: Insufficient documentation

## 2020-06-14 MED ORDER — KETOROLAC TROMETHAMINE 60 MG/2ML IM SOLN
60.0000 mg | Freq: Once | INTRAMUSCULAR | Status: AC
  Administered 2020-06-14: 60 mg via INTRAMUSCULAR

## 2020-06-14 MED ORDER — ESOMEPRAZOLE MAGNESIUM 40 MG PO CPDR
40.0000 mg | DELAYED_RELEASE_CAPSULE | Freq: Every day | ORAL | 3 refills | Status: DC
  Filled 2020-06-14: qty 30, 30d supply, fill #0

## 2020-06-14 MED ORDER — SUCRALFATE 1 G PO TABS
1.0000 g | ORAL_TABLET | Freq: Three times a day (TID) | ORAL | 0 refills | Status: DC
Start: 2020-06-14 — End: 2020-06-19
  Filled 2020-06-14: qty 60, 15d supply, fill #0

## 2020-06-14 MED ORDER — TRAZODONE HCL 50 MG PO TABS
25.0000 mg | ORAL_TABLET | Freq: Every evening | ORAL | 3 refills | Status: DC | PRN
  Filled 2020-06-14: qty 30, 30d supply, fill #0

## 2020-06-14 MED ORDER — SUCRALFATE 1 G PO TABS
1.0000 g | ORAL_TABLET | Freq: Three times a day (TID) | ORAL | 0 refills | Status: DC
Start: 2020-06-14 — End: 2020-06-14

## 2020-06-14 NOTE — Patient Instructions (Addendum)
If you do not hear anything about your referral in the next 1-2 weeks, call our office and ask for an update.  We are changing the Aciphex to Nexium.  We will be in touch regarding your results. If symptoms worsen, go to the ER.  Let us know if you need anything.

## 2020-06-14 NOTE — Progress Notes (Signed)
Chief Complaint  Patient presents with  . Gastroesophageal Reflux  . Insomnia    Subjective: Patient is a 65 y.o. female here for f/u headache.  She is here with her spouse.  The patient was seen yesterday for a very severe headache.  She rates as a 10/10 today.  She is now having dizziness where she feels that she is spinning.  It is constant and not related to position.  She is seen down and currently experiencing the dizziness.  She denies any specific weakness.  Vision is unaffected.  No difficulty with speech or trouble swallowing.  Still not sleeping.   She is having worsening reflux.  She has failed AcipHex and pantoprazole.  She has never seen a gastroenterologist.  She denies any bleeding or dark tarry stools.    Past Medical History:  Diagnosis Date  . Diabetes mellitus type 2 in obese (HCC)   . Hypertension     Objective: BP 112/72 (BP Location: Left Arm, Patient Position: Sitting, Cuff Size: Normal)   Pulse 70   Temp 98.6 F (37 C) (Oral)   SpO2 97%  General: Awake, appears uncomfortable HEENT: MMM, unable to open eyes for prolonged periods of time Heart: RRR, no murmurs Lungs: CTAB, no rales, wheezes or rhonchi. No accessory muscle use Abd: +pain in epigastric region to palpation. ND, no masses or organomegaly Neuro: DTRs equal and symmetric throughout, no clonus, 5/5 strength throughout, she is not able to ambulate due to dizziness Psych: Age appropriate judgment and insight, normal affect and mood  Assessment and Plan: Intractable acute headache - Plan: CT Head Wo Contrast  Dizziness  Gastroesophageal reflux disease, unspecified whether esophagitis present - Plan: Ambulatory referral to Gastroenterology  Vertigo of central origin - Plan: CT Head Wo Contrast  CT head neg thankfully. Toradol today. Will send in trazodone as needed for sleep. Refer to gastroenterology, change AcipHex to Nexium.  Add Carafate.  Could use Pepcid as needed as well. The patient and  spouse voiced understanding and agreement to the plan.  Greater than 40 minutes were spent with the patient/spouse, coordinating care, in addition to reviewing their chart information on the same day of the visit.   Jilda Roche [place], DO 06/14/20  5:01 PM

## 2020-06-16 ENCOUNTER — Other Ambulatory Visit (HOSPITAL_BASED_OUTPATIENT_CLINIC_OR_DEPARTMENT_OTHER): Payer: Self-pay

## 2020-06-16 ENCOUNTER — Other Ambulatory Visit: Payer: Self-pay | Admitting: Family Medicine

## 2020-06-16 MED ORDER — PROMETHAZINE HCL 25 MG PO TABS
25.0000 mg | ORAL_TABLET | Freq: Three times a day (TID) | ORAL | 0 refills | Status: DC | PRN
  Filled 2020-06-16: qty 20, 7d supply, fill #0

## 2020-06-17 ENCOUNTER — Other Ambulatory Visit: Payer: Self-pay

## 2020-06-17 ENCOUNTER — Inpatient Hospital Stay (HOSPITAL_BASED_OUTPATIENT_CLINIC_OR_DEPARTMENT_OTHER)
Admission: EM | Admit: 2020-06-17 | Discharge: 2020-06-19 | DRG: 641 | Disposition: A | Discharge status: Home / Self Care | Payer: 59 | Attending: Family Medicine | Admitting: Family Medicine

## 2020-06-17 ENCOUNTER — Observation Stay (HOSPITAL_COMMUNITY): Payer: 59

## 2020-06-17 ENCOUNTER — Encounter (HOSPITAL_BASED_OUTPATIENT_CLINIC_OR_DEPARTMENT_OTHER): Payer: Self-pay | Admitting: Emergency Medicine

## 2020-06-17 DIAGNOSIS — E871 Hypo-osmolality and hyponatremia: Secondary | ICD-10-CM | POA: Diagnosis not present

## 2020-06-17 DIAGNOSIS — G4700 Insomnia, unspecified: Secondary | ICD-10-CM | POA: Diagnosis present

## 2020-06-17 DIAGNOSIS — K449 Diaphragmatic hernia without obstruction or gangrene: Secondary | ICD-10-CM | POA: Diagnosis present

## 2020-06-17 DIAGNOSIS — E860 Dehydration: Secondary | ICD-10-CM

## 2020-06-17 DIAGNOSIS — E861 Hypovolemia: Secondary | ICD-10-CM | POA: Diagnosis not present

## 2020-06-17 DIAGNOSIS — R519 Headache, unspecified: Secondary | ICD-10-CM

## 2020-06-17 DIAGNOSIS — I10 Essential (primary) hypertension: Secondary | ICD-10-CM | POA: Diagnosis present

## 2020-06-17 DIAGNOSIS — E119 Type 2 diabetes mellitus without complications: Secondary | ICD-10-CM | POA: Diagnosis present

## 2020-06-17 DIAGNOSIS — R001 Bradycardia, unspecified: Secondary | ICD-10-CM | POA: Diagnosis present

## 2020-06-17 DIAGNOSIS — K219 Gastro-esophageal reflux disease without esophagitis: Secondary | ICD-10-CM

## 2020-06-17 DIAGNOSIS — Z8249 Family history of ischemic heart disease and other diseases of the circulatory system: Secondary | ICD-10-CM

## 2020-06-17 DIAGNOSIS — Z79899 Other long term (current) drug therapy: Secondary | ICD-10-CM

## 2020-06-17 DIAGNOSIS — R112 Nausea with vomiting, unspecified: Secondary | ICD-10-CM | POA: Diagnosis present

## 2020-06-17 DIAGNOSIS — E785 Hyperlipidemia, unspecified: Secondary | ICD-10-CM | POA: Diagnosis present

## 2020-06-17 DIAGNOSIS — Z20822 Contact with and (suspected) exposure to covid-19: Secondary | ICD-10-CM | POA: Diagnosis present

## 2020-06-17 LAB — BASIC METABOLIC PANEL
Anion gap: 11 (ref 5–15)
BUN: 15 mg/dL (ref 8–23)
CO2: 23 mmol/L (ref 22–32)
Calcium: 9.2 mg/dL (ref 8.9–10.3)
Chloride: 93 mmol/L — ABNORMAL LOW (ref 98–111)
Creatinine, Ser: 0.84 mg/dL (ref 0.44–1.00)
GFR, Estimated: >60 mL/min (ref >60)
Glucose, Bld: 132 mg/dL — ABNORMAL HIGH (ref 70–99)
Potassium: 3.6 mmol/L (ref 3.5–5.1)
Sodium: 127 mmol/L — ABNORMAL LOW (ref 135–145)

## 2020-06-17 LAB — URINALYSIS, ROUTINE W REFLEX MICROSCOPIC
Bilirubin Urine: NEGATIVE
Glucose, UA: NEGATIVE mg/dL
Hgb urine dipstick: NEGATIVE
Ketones, ur: NEGATIVE mg/dL
Leukocytes,Ua: NEGATIVE
Nitrite: NEGATIVE
Protein, ur: NEGATIVE mg/dL
Specific Gravity, Urine: 1.01 (ref 1.005–1.030)
pH: 7 (ref 5.0–8.0)

## 2020-06-17 LAB — COMPREHENSIVE METABOLIC PANEL
ALT: 19 U/L (ref 0–44)
AST: 25 U/L (ref 15–41)
Albumin: 4.1 g/dL (ref 3.5–5.0)
Alkaline Phosphatase: 59 U/L (ref 38–126)
Anion gap: 11 (ref 5–15)
BUN: 14 mg/dL (ref 8–23)
CO2: 24 mmol/L (ref 22–32)
Calcium: 9.7 mg/dL (ref 8.9–10.3)
Chloride: 82 mmol/L — ABNORMAL LOW (ref 98–111)
Creatinine, Ser: 0.89 mg/dL (ref 0.44–1.00)
GFR, Estimated: >60 mL/min (ref >60)
Glucose, Bld: 184 mg/dL — ABNORMAL HIGH (ref 70–99)
Potassium: 3.2 mmol/L — ABNORMAL LOW (ref 3.5–5.1)
Sodium: 117 mmol/L — CL (ref 135–145)
Total Bilirubin: 0.7 mg/dL (ref 0.3–1.2)
Total Protein: 7.9 g/dL (ref 6.5–8.1)

## 2020-06-17 LAB — CBC WITH DIFFERENTIAL/PLATELET
Abs Immature Granulocytes: 0.01 K/uL (ref 0.00–0.07)
Basophils Absolute: 0 K/uL (ref 0.0–0.1)
Basophils Relative: 0 %
Eosinophils Absolute: 0.1 K/uL (ref 0.0–0.5)
Eosinophils Relative: 1 %
HCT: 36.9 % (ref 36.0–46.0)
Hemoglobin: 13.2 g/dL (ref 12.0–15.0)
Immature Granulocytes: 0 %
Lymphocytes Relative: 20 %
Lymphs Abs: 1.3 K/uL (ref 0.7–4.0)
MCH: 29 pg (ref 26.0–34.0)
MCHC: 35.8 g/dL (ref 30.0–36.0)
MCV: 81.1 fL (ref 80.0–100.0)
Monocytes Absolute: 0.7 K/uL (ref 0.1–1.0)
Monocytes Relative: 11 %
Neutro Abs: 4.5 K/uL (ref 1.7–7.7)
Neutrophils Relative %: 68 %
Platelets: 238 K/uL (ref 150–400)
RBC: 4.55 MIL/uL (ref 3.87–5.11)
RDW: 11.4 % — ABNORMAL LOW (ref 11.5–15.5)
WBC: 6.7 K/uL (ref 4.0–10.5)
nRBC: 0 % (ref 0.0–0.2)

## 2020-06-17 LAB — GLUCOSE, CAPILLARY
Glucose-Capillary: 203 mg/dL — ABNORMAL HIGH (ref 70–99)
Glucose-Capillary: 91 mg/dL (ref 70–99)

## 2020-06-17 LAB — CREATININE, URINE, RANDOM: Creatinine, Urine: 32.87 mg/dL

## 2020-06-17 LAB — RESP PANEL BY RT-PCR (FLU A&B, COVID) ARPGX2
Influenza A by PCR: NEGATIVE
Influenza B by PCR: NEGATIVE
SARS Coronavirus 2 by RT PCR: NEGATIVE

## 2020-06-17 LAB — TSH: TSH: 1.552 u[IU]/mL (ref 0.350–4.500)

## 2020-06-17 LAB — SODIUM, URINE, RANDOM
Sodium, Ur: 35 mmol/L
Sodium, Ur: 12 mmol/L

## 2020-06-17 LAB — OSMOLALITY: Osmolality: 267 mOsm/kg — ABNORMAL LOW (ref 275–295)

## 2020-06-17 MED ORDER — POLYETHYLENE GLYCOL 3350 17 G PO PACK
17.0000 g | PACK | Freq: Every day | ORAL | Status: DC
  Administered 2020-06-17 – 2020-06-19 (×3): 17 g via ORAL
  Filled 2020-06-17 (×3): qty 1

## 2020-06-17 MED ORDER — CARVEDILOL 25 MG PO TABS
25.0000 mg | ORAL_TABLET | Freq: Two times a day (BID) | ORAL | Status: DC
  Administered 2020-06-17 – 2020-06-19 (×4): 25 mg via ORAL
  Filled 2020-06-17 (×4): qty 1

## 2020-06-17 MED ORDER — PANTOPRAZOLE SODIUM 40 MG IV SOLR
40.0000 mg | Freq: Two times a day (BID) | INTRAVENOUS | Status: DC
  Administered 2020-06-17: 40 mg via INTRAVENOUS
  Filled 2020-06-17: qty 40

## 2020-06-17 MED ORDER — SODIUM CHLORIDE 0.9 % IV BOLUS
1000.0000 mL | Freq: Once | INTRAVENOUS | Status: AC
Start: 2020-06-17 — End: 2020-06-17
  Administered 2020-06-17: 1000 mL via INTRAVENOUS

## 2020-06-17 MED ORDER — SODIUM CHLORIDE 0.9 % IV SOLN: INTRAVENOUS | Status: DC

## 2020-06-17 MED ORDER — GUANFACINE HCL 1 MG PO TABS
2.0000 mg | ORAL_TABLET | Freq: Every day | ORAL | Status: DC
  Administered 2020-06-18: 2 mg via ORAL
  Filled 2020-06-17 (×2): qty 2

## 2020-06-17 MED ORDER — METOCLOPRAMIDE HCL 5 MG/ML IJ SOLN
10.0000 mg | Freq: Once | INTRAMUSCULAR | Status: AC
  Administered 2020-06-17: 10 mg via INTRAVENOUS
  Filled 2020-06-17: qty 2

## 2020-06-17 MED ORDER — ONDANSETRON 4 MG PO TBDP: 4.0000 mg | ORAL_TABLET | Freq: Three times a day (TID) | ORAL | Status: DC | PRN

## 2020-06-17 MED ORDER — METOPROLOL TARTRATE 50 MG PO TABS: 50.0000 mg | ORAL_TABLET | Freq: Every day | ORAL | Status: DC | PRN

## 2020-06-17 MED ORDER — INSULIN ASPART 100 UNIT/ML ~~LOC~~ SOLN: 0.0000 [IU] | Freq: Every day | SUBCUTANEOUS | Status: DC

## 2020-06-17 MED ORDER — INSULIN ASPART 100 UNIT/ML ~~LOC~~ SOLN
0.0000 [IU] | Freq: Three times a day (TID) | SUBCUTANEOUS | Status: DC
  Administered 2020-06-17: 3 [IU] via SUBCUTANEOUS
  Administered 2020-06-18 (×2): 2 [IU] via SUBCUTANEOUS

## 2020-06-17 MED ORDER — ONDANSETRON HCL 4 MG PO TABS: 4.0000 mg | ORAL_TABLET | Freq: Four times a day (QID) | ORAL | Status: DC | PRN

## 2020-06-17 MED ORDER — ONDANSETRON HCL 4 MG/2ML IJ SOLN: 4.0000 mg | Freq: Four times a day (QID) | INTRAMUSCULAR | Status: DC | PRN

## 2020-06-17 MED ORDER — DIPHENHYDRAMINE HCL 50 MG/ML IJ SOLN
12.5000 mg | Freq: Once | INTRAMUSCULAR | Status: AC
  Administered 2020-06-17: 12.5 mg via INTRAVENOUS
  Filled 2020-06-17: qty 1

## 2020-06-17 MED ORDER — AMLODIPINE BESYLATE 5 MG PO TABS
5.0000 mg | ORAL_TABLET | Freq: Two times a day (BID) | ORAL | Status: DC
  Administered 2020-06-18 – 2020-06-19 (×3): 5 mg via ORAL
  Filled 2020-06-17 (×3): qty 1

## 2020-06-17 MED ORDER — CALCIUM CARBONATE ANTACID 500 MG PO CHEW
1.0000 | CHEWABLE_TABLET | Freq: Three times a day (TID) | ORAL | Status: DC | PRN
  Administered 2020-06-17 – 2020-06-18 (×2): 200 mg via ORAL
  Filled 2020-06-17 (×2): qty 1

## 2020-06-17 MED ORDER — ATORVASTATIN CALCIUM 40 MG PO TABS
40.0000 mg | ORAL_TABLET | Freq: Every day | ORAL | Status: DC
  Administered 2020-06-17 – 2020-06-19 (×3): 40 mg via ORAL
  Filled 2020-06-17 (×3): qty 1

## 2020-06-17 MED ORDER — TRAZODONE HCL 50 MG PO TABS
50.0000 mg | ORAL_TABLET | Freq: Every evening | ORAL | Status: DC | PRN
  Administered 2020-06-17 – 2020-06-18 (×2): 50 mg via ORAL
  Filled 2020-06-17 (×2): qty 1

## 2020-06-17 MED ORDER — KETOROLAC TROMETHAMINE 30 MG/ML IJ SOLN
30.0000 mg | Freq: Once | INTRAMUSCULAR | Status: AC
Start: 2020-06-17 — End: 2020-06-17
  Administered 2020-06-17: 30 mg via INTRAVENOUS
  Filled 2020-06-17: qty 1

## 2020-06-17 NOTE — ED Notes (Signed)
Pt ambulated to the bathroom steady gait

## 2020-06-17 NOTE — ED Notes (Signed)
Critical lab Okey Regal reporting  Na+ 117 - MD Charm Barges advised

## 2020-06-17 NOTE — ED Triage Notes (Signed)
Pt reports headaches x4 days, vomiting and weakness. States no diarrhea, no fevers. Seen by PCP with negative CT this week.

## 2020-06-17 NOTE — ED Notes (Signed)
Spent 10+ minutes explaining the hospital process to the pt and spouse. First hospitalization for pt. Gave spouse visiting hours, room # and phone #. Belongings were bagged and pt label applied  Reviewed what to expect at the hospital - some concerns about medications. I stated that a hospitalist would be over pt care and would enter orders for pt home medications. The meds might be a bit delayed in the transition but pt would receive - some might be withheld due to Na+ levels

## 2020-06-17 NOTE — ED Provider Notes (Signed)
[facility] EMERGENCY DEPARTMENT Provider Note   CSN: [id number] Arrival date & time: 06/17/20  4098     History Chief Complaint  Patient presents with  . Headache    Elizabeth [name] is a 65 y.o. female.  She speaks [place] and her husband is providing the history.  They declined interpreter.  She is complaining of headache generalized x4 days.  9 out of 10 intensity.  Associated with nausea and vomiting.  No abdominal pain.  Sometimes has some blurry vision and sometimes feels dizzy.  Has seen PCP for this and has been given different medications without improvement.  Had an outpatient CAT scan of her head noncontrast that was negative.  Continues to be symptomatic.  Not eating and drinking due to symptoms.  Feels dehydrated.  The history is provided by the patient and the spouse.  Headache Pain location:  Generalized Quality:  Dull Severity currently:  9/10 Severity at highest:  9/10 Onset quality:  Gradual Duration:  4 days Timing:  Constant Progression:  Unchanged Chronicity:  New Relieved by:  Nothing Worsened by:  Nothing Ineffective treatments:  Prescription medications Associated symptoms: dizziness, nausea, visual change and vomiting   Associated symptoms: no abdominal pain, no eye pain, no fever, no focal weakness, no neck pain, no numbness, no sore throat and no weakness        Past Medical History:  Diagnosis Date  . Diabetes mellitus type 2 in obese ([place])   . Hypertension     Patient Active Problem List   Diagnosis Date Noted  . Diabetes mellitus type 2 in obese ([place])   . Type 2 diabetes mellitus with hyperglycemia, without long-term current use of insulin ([place]) 02/20/2018  . Gastroesophageal reflux disease 01/30/2018  . Chronic constipation 01/30/2018  . Leg swelling 07/03/2016  . Pedal edema 04/22/2015  . Essential hypertension, benign 07/16/2013  . Mixed hyperlipidemia 07/16/2013  . Undiagnosed cardiac murmurs 07/16/2013    Past  Surgical History:  Procedure Laterality Date  . CESAREAN SECTION       OB History   No obstetric history on file.     Family History  Problem Relation Age of Onset  . Hypertension Mother   . Heart attack Neg Hx   . Stroke Neg Hx     Social History   Tobacco Use  . Smoking status: Never Smoker  . Smokeless tobacco: Never Used  Vaping Use  . Vaping Use: Never used  Substance Use Topics  . Alcohol use: No  . Drug use: No    Home Medications Prior to Admission medications   Medication Sig Start Date End Date Taking? Authorizing Provider  promethazine (PHENERGAN) 25 MG tablet Take 1 tablet (25 mg total) by mouth every 8 (eight) hours as needed for nausea or vomiting. 06/16/20   Shelda Pal, DO  amLODipine (NORVASC) 5 MG tablet TAKE 1 TABLET(5 MG) BY MOUTH TWICE DAILY OR AS DIRECTED 11/09/19   Shelda Pal, DO  atorvastatin (LIPITOR) 40 MG tablet TAKE 1 TABLET(40 MG) BY MOUTH DAILY 12/25/19   Wendling, Crosby Oyster, DO  blood glucose meter kit and supplies KIT daily as needed. Dispense based on patient and insurance preference. Use up to once  daily as directed.    [provider]  carvedilol (COREG) 25 MG tablet Take 1 tablet (25 mg total) by mouth 2 (two) times daily with a meal. 05/04/20   Wendling, Crosby Oyster, DO  chlorthalidone (HYGROTON) 25 MG tablet TAKE 1 TABLET(25 MG)  BY MOUTH DAILY 12/24/19   Wendling, Crosby Oyster, DO  cyclobenzaprine (FLEXERIL) 10 MG tablet Take 0.5-1 tablets (5-10 mg total) by mouth 3 (three) times daily as needed for muscle spasms. 03/17/19   Shelda Pal, DO  esomeprazole (NEXIUM) 40 MG capsule Take 1 capsule (40 mg total) by mouth daily. 06/14/20   Shelda Pal, DO  furosemide (LASIX) 40 MG tablet TAKE 1 TABLET(40 MG) BY MOUTH DAILY 04/05/20   Shelda Pal, DO  glucose blood (IGLUCOSE TEST STRIPS) test strip Dispense based on patient and insurance preference. Test once daily to check blood  sugar. 02/20/18   Shelda Pal, DO  guanFACINE (TENEX) 1 MG tablet TAKE 2 TABLETS(2 MG) BY MOUTH EVERY NIGHT 04/11/20   Wendling, Crosby Oyster, DO  Lancets MISC Dispense based on patient and insurance preference.  Test once daily to check blood sugar.    [provider]  losartan (COZAAR) 100 MG tablet TAKE 1 TABLET(100 MG) BY MOUTH DAILY 06/08/20   Nani Ravens, Crosby Oyster, DO  metoprolol tartrate (LOPRESSOR) 50 MG tablet Take 1 tablet by mouth daily as needed if BP is 150-180 03/28/20   Wendling, Crosby Oyster, DO  ondansetron (ZOFRAN-ODT) 4 MG disintegrating tablet Take 1 tablet (4 mg total) by mouth every 8 (eight) hours as needed for nausea or vomiting. 06/13/20   Shelda Pal, DO  potassium chloride (KLOR-CON) 10 MEQ tablet TAKE 2 TABLETS BY MOUTH WITH EVERY DOSE OF LASIX 01/04/20   Shelda Pal, DO  spironolactone (ALDACTONE) 25 MG tablet TAKE 1 TABLET(25 MG) BY MOUTH DAILY 06/13/20   Nani Ravens, Crosby Oyster, DO  sucralfate (CARAFATE) 1 g tablet Take 1 tablet (1 g total) by mouth 4 (four) times daily -  with meals and at bedtime. 06/14/20   Shelda Pal, DO  traZODone (DESYREL) 50 MG tablet Take 1/2-1 tablet (25-50 mg total) by mouth at bedtime as needed for sleep. 06/14/20   Shelda Pal, DO    Allergies    Patient has no known allergies.  Review of Systems   Review of Systems  Constitutional: Negative for fever.  HENT: Negative for sore throat.   Eyes: Positive for visual disturbance. Negative for pain.  Respiratory: Negative for shortness of breath.   Cardiovascular: Negative for chest pain.  Gastrointestinal: Positive for nausea and vomiting. Negative for abdominal pain.  Genitourinary: Negative for dysuria.  Musculoskeletal: Negative for neck pain.  Skin: Negative for rash.  Neurological: Positive for dizziness and headaches. Negative for focal weakness, weakness and numbness.    Physical Exam Updated Vital Signs BP (!)  148/67   Pulse 92   Temp 98.6 F (37 C)   Resp 18   Wt 73.7 kg   SpO2 100%   BMI 31.72 kg/m   Physical Exam Vitals and nursing note reviewed.  Constitutional:      General: She is not in acute distress.    Appearance: She is well-developed.  HENT:     Head: Normocephalic and atraumatic.  Eyes:     Conjunctiva/sclera: Conjunctivae normal.  Cardiovascular:     Rate and Rhythm: Normal rate and regular rhythm.     Heart sounds: No murmur heard.   Pulmonary:     Effort: Pulmonary effort is normal. No respiratory distress.     Breath sounds: Normal breath sounds.  Abdominal:     Palpations: Abdomen is soft.     Tenderness: There is no abdominal tenderness.  Musculoskeletal:     Cervical back:  Neck supple.  Skin:    General: Skin is warm and dry.  Neurological:     Mental Status: She is alert.     GCS: GCS eye subscore is 4. GCS verbal subscore is 5. GCS motor subscore is 6.     Cranial Nerves: No cranial nerve deficit or dysarthria.     Sensory: No sensory deficit.     Motor: No weakness.     ED Results / Procedures / Treatments   Labs (all labs ordered are listed, but only abnormal results are displayed) Labs Reviewed  CBC WITH DIFFERENTIAL/PLATELET - Abnormal; Notable for the following components:      Result Value   RDW 11.4 (*)    All other components within normal limits  COMPREHENSIVE METABOLIC PANEL - Abnormal; Notable for the following components:   Sodium 117 (*)    Potassium 3.2 (*)    Chloride 82 (*)    Glucose, Bld 184 (*)    All other components within normal limits  BASIC METABOLIC PANEL - Abnormal; Notable for the following components:   Sodium 127 (*)    Chloride 93 (*)    Glucose, Bld 132 (*)    All other components within normal limits  GLUCOSE, CAPILLARY - Abnormal; Notable for the following components:   Glucose-Capillary 203 (*)    All other components within normal limits  RESP PANEL BY RT-PCR (FLU A&B, COVID) ARPGX2  URINALYSIS,  ROUTINE W REFLEX MICROSCOPIC  SODIUM, URINE, RANDOM  CREATININE, URINE, RANDOM  TSH  OSMOLALITY, URINE  OSMOLALITY  SODIUM, URINE, RANDOM  COMPREHENSIVE METABOLIC PANEL  CBC    EKG EKG Interpretation  Date/Time:  Friday June 17 2020 06:58:43 EDT Ventricular Rate:  86 PR Interval:  187 QRS Duration: 84 QT Interval:  357 QTC Calculation: 427 R Axis:   61 Text Interpretation: Sinus rhythm Abnormal R-wave progression, early transition No significant change since prior 9/15 Confirmed by Aletta Edouard [phone]) on 06/17/2020 7:20:38 AM   Radiology DG ESOPHAGUS W DOUBLE CM (HD)  Result Date: 06/17/2020 CLINICAL DATA:  Nausea and vomiting.  Acid reflux. EXAM: ESOPHOGRAM / BARIUM SWALLOW / BARIUM TABLET STUDY TECHNIQUE: Combined double contrast and single contrast examination performed using effervescent crystals, thick barium liquid, and thin barium liquid. The patient was observed with fluoroscopy swallowing a 13 mm barium sulphate tablet. FLUOROSCOPY TIME:  Fluoroscopy Time:  1 minutes 0 seconds Radiation Exposure Index (if provided by the fluoroscopic device): Number of Acquired Spot Images: 7 COMPARISON:  None. FINDINGS: Normal pharyngeal swallowing. Esophageal mucosa and motility normal. No stricture or mass. Moderate hiatal hernia. Moderate gastroesophageal reflux to the midesophagus. Barium tablet passed readily in the stomach.  No stricture. IMPRESSION: Hiatal hernia with moderate gastroesophageal reflux. Negative for stricture. Electronically Signed   By: Franchot Gallo M.D.   On: 06/17/2020 15:57    Procedures .Critical Care Performed by: Hayden Rasmussen, MD Authorized by: Hayden Rasmussen, MD   Critical care provider statement:    Critical care time (minutes):  45   Critical care time was exclusive of:  Separately billable procedures and treating other patients   Critical care was necessary to treat or prevent imminent or life-threatening deterioration of the following  conditions:  Metabolic crisis   Critical care was time spent personally by me on the following activities:  Discussions with consultants, evaluation of patient's response to treatment, examination of patient, ordering and performing treatments and interventions, ordering and review of laboratory studies, ordering and review of radiographic  studies, pulse oximetry, re-evaluation of patient's condition, obtaining history from patient or surrogate, review of old charts and development of treatment plan with patient or surrogate     Medications Ordered in ED Medications  insulin aspart (novoLOG) injection 0-9 Units (3 Units Subcutaneous Given 06/17/20 1728)  insulin aspart (novoLOG) injection 0-5 Units (has no administration in time range)  amLODipine (NORVASC) tablet 5 mg (has no administration in time range)  atorvastatin (LIPITOR) tablet 40 mg (40 mg Oral Given 06/17/20 1723)  carvedilol (COREG) tablet 25 mg (25 mg Oral Given 06/17/20 1724)  guanFACINE (TENEX) tablet 2 mg (has no administration in time range)  metoprolol tartrate (LOPRESSOR) tablet 50 mg (has no administration in time range)  traZODone (DESYREL) tablet 50 mg (has no administration in time range)  ondansetron (ZOFRAN-ODT) disintegrating tablet 4 mg (has no administration in time range)  polyethylene glycol (MIRALAX / GLYCOLAX) packet 17 g (17 g Oral Given 06/17/20 1729)  ondansetron (ZOFRAN) tablet 4 mg (has no administration in time range)    Or  ondansetron (ZOFRAN) injection 4 mg (has no administration in time range)  pantoprazole (PROTONIX) injection 40 mg (40 mg Intravenous Given 06/17/20 1726)  sodium chloride 0.9 % bolus 1,000 mL (0 mLs Intravenous Stopped 06/17/20 0831)  metoCLOPramide (REGLAN) injection 10 mg (10 mg Intravenous Given 06/17/20 0728)  diphenhydrAMINE (BENADRYL) injection 12.5 mg (12.5 mg Intravenous Given 06/17/20 0729)  ketorolac (TORADOL) 30 MG/ML injection 30 mg (30 mg Intravenous Given 06/17/20 0730)    ED Course   I have reviewed the triage vital signs and the nursing notes.  Pertinent labs & imaging results that were available during my care of the patient were reviewed by me and considered in my medical decision making (see chart for details).  Clinical Course as of 06/17/20 1808  Fri Jun 17, 2020  0757 New meds that patient was started on include trazodone, Phenergan and Carafate.  Also was prescribed amitriptyline at bedtime and a dose of Solu-Medrol although unclear if is on this currently.  She is on diuretics chronically so this may have affected her sodium.  1 month ago sodium was 139. [MB]  8502 Discussed with Dr. Wyline Copas from [facility] hospitalist who will accept the patient for admission. [MB]  7741 Her headache is much improved after medications. [MB]    Clinical Course User Index [MB] Hayden Rasmussen, MD   MDM Rules/Calculators/A&P                         This patient complains of headache nausea vomiting weakness; this involves an extensive number of treatment Options and is a complaint that carries with it a high risk of complications and Morbidity. The differential includes bleed, stroke, migraine, dehydration, renal failure, metabolic derangement  I ordered, reviewed and interpreted labs, which included CBC with normal white count normal hemoglobin, chemistries with critically low sodium of 117, low potassium and chloride, normal LFTs, urinalysis without signs of infection, Covid testing negative I ordered medication IV fluids, migraine cocktail with improvement in her symptoms Additional history obtained from patient's husband Previous records obtained and reviewed in epic including PCP visits I consulted Dr. Wyline Copas [facility] hospital[facility] and discussed lab and imaging findings  Critical Interventions: Work-up and management of patient's acute hyponatremia  After the interventions stated above, I reevaluated the patient and found patient to be symptomatically improved.  She is at high risk  for seizures will need to be admitted to the hospital for continued  management of her hyponatremia.   Final Clinical Impression(s) / ED Diagnoses Final diagnoses:  Hyponatremia  Generalized headache  Dehydration    Rx / DC Orders ED Discharge Orders    None       Hayden Rasmussen, MD 06/17/20 [phone]

## 2020-06-17 NOTE — ED Notes (Signed)
Report given to [facility], [place] RN - room changed to 1318

## 2020-06-17 NOTE — ED Notes (Addendum)
IV bolus completed - advised pt that urine is needed and described the sample process.  Pt stated that she cannot provide a sample at this time  Pt and SO advised of the ED transfer process

## 2020-06-17 NOTE — ED Notes (Signed)
Report given to [place] with [facility].  

## 2020-06-17 NOTE — Progress Notes (Signed)
Pt BP 109/48, pulse 56. Pt asymptomatic. On call [place] notified via secure chat. BP meds held tonight.

## 2020-06-17 NOTE — H&P (Signed)
History and Physical    Ellah Otte UDJ:[id number] DOB: 09/08/1955 DOA: 06/17/2020  PCP: Shelda Pal, DO  Patient coming from: home  I have personally briefly reviewed patient's old medical records in [place]  Chief Complaint: nausea/vomiting  HPI: Elizabeth [name] is Lynnmarie Lovett 65 y.o. female with medical history significant of diabetes, hypertension presenting with headaches, nausea, vomiting, and weakness.    History is obtained from patient and her husband.  Symptoms have been present about 6 days.  They note nausea and reflux symptoms.  Keeps her from sleeping at night.  She's unable to tolerate any PO.  Anything she eats comes back up.  She's had reflux for 2-3 months and is careful with her diet, but recently has been worsening.  She saw Dr. Nani Ravens on 4/5 for nausea/vomiting, headache, and dizziness.  Dr. Nani Ravens adjust medication regimen to start nexium and add carafate.  Karsyn Jamie head CT was negative for acute findings.  They came to the ED today with persistent symptoms.  She notes subjective chills, no chest pain, no SOB, no abdominal discomfort.  No smoking or drinking.   ED Course: labs.  IVF.  Admit for hyponatremia.   Review of Systems: As per HPI otherwise all other systems reviewed and are negative.  Past Medical History:  Diagnosis Date  . Diabetes mellitus type 2 in obese ([place])   . Hypertension     Past Surgical History:  Procedure Laterality Date  . CESAREAN SECTION      Social History  reports that she has never smoked. She has never used smokeless tobacco. She reports that she does not drink alcohol and does not use drugs.  No Known Allergies  Family History  Problem Relation Age of Onset  . Hypertension Mother   . Heart attack Neg Hx   . Stroke Neg Hx     Prior to Admission medications   Medication Sig Start Date End Date Taking? Authorizing Provider  amLODipine (NORVASC) 5 MG tablet TAKE 1 TABLET(5 MG) BY MOUTH TWICE DAILY OR AS  DIRECTED Patient taking differently: Take 10 mg by mouth in the morning and at bedtime. 11/09/19  Yes Shelda Pal, DO  atorvastatin (LIPITOR) 40 MG tablet TAKE 1 TABLET(40 MG) BY MOUTH DAILY 12/25/19  Yes Shelda Pal, DO  blood glucose meter kit and supplies KIT daily as needed. Dispense based on patient and insurance preference. Use up to once  daily as directed.   Yes [provider]  carvedilol (COREG) 25 MG tablet Take 1 tablet (25 mg total) by mouth 2 (two) times daily with Valaria Kohut meal. 05/04/20  Yes Wendling, Crosby Oyster, DO  chlorthalidone (HYGROTON) 25 MG tablet TAKE 1 TABLET(25 MG) BY MOUTH DAILY 12/24/19  Yes Wendling, Crosby Oyster, DO  cyclobenzaprine (FLEXERIL) 10 MG tablet Take 0.5-1 tablets (5-10 mg total) by mouth 3 (three) times daily as needed for muscle spasms. 03/17/19  Yes Shelda Pal, DO  esomeprazole (NEXIUM) 40 MG capsule Take 1 capsule (40 mg total) by mouth daily. 06/14/20  Yes Shelda Pal, DO  furosemide (LASIX) 40 MG tablet TAKE 1 TABLET(40 MG) BY MOUTH DAILY Patient taking differently: Take 40 mg by mouth daily as needed for fluid or edema. 04/05/20  Yes Shelda Pal, DO  glucose blood (IGLUCOSE TEST STRIPS) test strip Dispense based on patient and insurance preference. Test once daily to check blood sugar. 02/20/18  Yes Wendling, Crosby Oyster, DO  guanFACINE (TENEX) 1 MG tablet TAKE 2 TABLETS(2 MG)  BY MOUTH EVERY NIGHT 04/11/20  Yes Shelda Pal, DO  Lancets MISC Dispense based on patient and insurance preference.  Test once daily to check blood sugar.   Yes [provider]  losartan (COZAAR) 100 MG tablet TAKE 1 TABLET(100 MG) BY MOUTH DAILY 06/08/20  Yes Shelda Pal, DO  metoprolol tartrate (LOPRESSOR) 50 MG tablet Take 1 tablet by mouth daily as needed if BP is 150-180 Patient taking differently: Take 50 mg by mouth as directed. Take 1 tablet by mouth daily as needed if BP is  150-180 03/28/20  Yes Wendling, Crosby Oyster, DO  ondansetron (ZOFRAN-ODT) 4 MG disintegrating tablet Take 1 tablet (4 mg total) by mouth every 8 (eight) hours as needed for nausea or vomiting. 06/13/20  Yes Shelda Pal, DO  promethazine (PHENERGAN) 25 MG tablet Take 1 tablet (25 mg total) by mouth every 8 (eight) hours as needed for nausea or vomiting. 06/16/20  Yes Shelda Pal, DO  spironolactone (ALDACTONE) 25 MG tablet TAKE 1 TABLET(25 MG) BY MOUTH DAILY 06/13/20  Yes Wendling, Crosby Oyster, DO  sucralfate (CARAFATE) 1 g tablet Take 1 tablet (1 g total) by mouth 4 (four) times daily -  with meals and at bedtime. 06/14/20  Yes Shelda Pal, DO  traZODone (DESYREL) 50 MG tablet Take 1/2-1 tablet (25-50 mg total) by mouth at bedtime as needed for sleep. 06/14/20  Yes Wendling, Crosby Oyster, DO  potassium chloride (KLOR-CON) 10 MEQ tablet TAKE 2 TABLETS BY MOUTH WITH EVERY DOSE OF LASIX Patient taking differently: Take 20 mEq by mouth as directed. TAKE 2 TABLETS (20 mEq) BY MOUTH DAILY AS NEEDED WITH EVERY DOSE OF LASIX 01/04/20   Shelda Pal, DO    Physical Exam: Vitals:   06/17/20 1205 06/17/20 1226 06/17/20 1230 06/17/20 1341  BP: (!) 118/54  (!) 105/46 (!) 130/56  Pulse: 67  69 66  Resp: '15  18 15  ' Temp:  98.8 F (37.1 C)  98.8 F (37.1 C)  TempSrc:  Oral  Oral  SpO2: 99%  98% 99%  Weight:      Height:        Constitutional: NAD, calm, comfortable Vitals:   06/17/20 1205 06/17/20 1226 06/17/20 1230 06/17/20 1341  BP: (!) 118/54  (!) 105/46 (!) 130/56  Pulse: 67  69 66  Resp: '15  18 15  ' Temp:  98.8 F (37.1 C)  98.8 F (37.1 C)  TempSrc:  Oral  Oral  SpO2: 99%  98% 99%  Weight:      Height:       Eyes: PERRL, lids and conjunctivae normal ENMT: Mucous membranes are moist. Neck: normal, supple Respiratory: clear to auscultation bilaterally, no wheezing, no crackles. Cardiovascular: Regular rate and rhythm, no murmurs / rubs /  gallops Abdomen: no tenderness, no masses palpated. No hepatosplenomegaly. Bowel sounds positive.  Musculoskeletal: no clubbing / cyanosis. No joint deformity upper and lower extremities. Good ROM, no contractures. Normal muscle tone.  Skin: no rashes, lesions, ulcers. No induration Neurologic: CN 2-12 grossly intact. Sensation intact. Strength 5/5 in all 4.  Psychiatric: Normal judgment and insight. Alert and oriented x 3. Normal mood.   Labs on Admission: I have personally reviewed following labs and imaging studies  CBC: Recent Labs  Lab 06/17/20 0704  WBC 6.7  NEUTROABS 4.5  HGB 13.2  HCT 36.9  MCV 81.1  PLT 761    Basic Metabolic Panel: Recent Labs  Lab 06/17/20 0704 06/17/20 1427  NA  117* 127*  K 3.2* 3.6  CL 82* 93*  CO2 24 23  GLUCOSE 184* 132*  BUN 14 15  CREATININE 0.89 0.84  CALCIUM 9.7 9.2    GFR: Estimated Creatinine Clearance: 60.7 mL/min (by C-G formula based on SCr of 0.84 mg/dL).  Liver Function Tests: Recent Labs  Lab 06/17/20 0704  AST 25  ALT 19  ALKPHOS 59  BILITOT 0.7  PROT 7.9  ALBUMIN 4.1    Urine analysis:    Component Value Date/Time   COLORURINE YELLOW 06/17/2020 1005   APPEARANCEUR CLEAR 06/17/2020 1005   LABSPEC 1.010 06/17/2020 1005   PHURINE 7.0 06/17/2020 1005   GLUCOSEU NEGATIVE 06/17/2020 1005   HGBUR NEGATIVE 06/17/2020 1005   BILIRUBINUR NEGATIVE 06/17/2020 1005   KETONESUR NEGATIVE 06/17/2020 1005   PROTEINUR NEGATIVE 06/17/2020 1005   NITRITE NEGATIVE 06/17/2020 1005   LEUKOCYTESUR NEGATIVE 06/17/2020 1005    Radiological Exams on Admission: DG ESOPHAGUS W DOUBLE CM (HD)  Result Date: 06/17/2020 CLINICAL DATA:  Nausea and vomiting.  Acid reflux. EXAM: ESOPHOGRAM / BARIUM SWALLOW / BARIUM TABLET STUDY TECHNIQUE: Combined double contrast and single contrast examination performed using effervescent crystals, thick barium liquid, and thin barium liquid. The patient was observed with fluoroscopy swallowing Thelmer Legler 13 mm  barium sulphate tablet. FLUOROSCOPY TIME:  Fluoroscopy Time:  1 minutes 0 seconds Radiation Exposure Index (if provided by the fluoroscopic device): Number of Acquired Spot Images: 7 COMPARISON:  None. FINDINGS: Normal pharyngeal swallowing. Esophageal mucosa and motility normal. No stricture or mass. Moderate hiatal hernia. Moderate gastroesophageal reflux to the midesophagus. Barium tablet passed readily in the stomach.  No stricture. IMPRESSION: Hiatal hernia with moderate gastroesophageal reflux. Negative for stricture. Electronically Signed   By: Franchot Gallo M.D.   On: 06/17/2020 15:57    EKG: Independently reviewed. Sinus, appears similar to priors  Assessment/Plan Active Problems:   Hyponatremia  Hypovolemic Hyponatremia Presented with Na 117 in setting of 6-7 days N/V and inability to take PO In addition on thiazide and spironolactone (on lasix prn) She's corrected rapidly with 1 L NS, supporting concern for hypovolemia Given rapid correction, will hold off on further IVF for now - follow orthostatics to help determine need for additional IVF Urine sodium 35.  Pending urine osm and serum osm. Normal TSH Not on any SSRI/SNRI  Nausea  Vomiting  Gastroesophageal Reflux Disease 2-3 months of severe reflux symptoms - she's very careful with diet Worsened over past 6-7 days Esophagram with hiatal hernia with moderate GERD, negative for stricture RD for education for GERD PPI BID.  Hold carafate.   If able to tolerate PO here, can plan for discharge for outpatient GI follow up.  If unable to tolerate PO, will need inpatient GI consult.  Dr. Nani Ravens placed GI consult on 4/5.    Hypertension Hold chlorthalidone and spironolactone due to hyponatremia Hold lasix (this is as needed medicine at home) Continue amlodipine and metoprolol prn Continue tenex  T2DM:  Ssi, follow A1c  Dyslipidemia Continue lipitor  Insomnia Likely 2/2 above nausea/vomiting/reflux symptoms Continue  trazodone  Headache Improved, follow CT 4/5 without acute findings  Dizziness Improved, follow orthostatics  DVT prophylaxis: SCD Code Status:   full Family Communication:  husband  Disposition Plan:   Patient is from:  home  Anticipated DC to:  home  Anticipated DC date:  4/9  Anticipated DC barriers: Improvement in PO intake  Consults called:  none  Admission status:  obs  Severity of Illness: The appropriate patient  status for this patient is OBSERVATION. Observation status is judged to be reasonable and necessary in order to provide the required intensity of service to ensure the patient's safety. The patient's presenting symptoms, physical exam findings, and initial radiographic and laboratory data in the context of their medical condition is felt to place them at decreased risk for further clinical deterioration. Furthermore, it is anticipated that the patient will be medically stable for discharge from the hospital within 2 midnights of admission. The following factors support the patient status of observation.   " The patient's presenting symptoms include headache, lightheadedness, nausea/vomiting. " The initial radiographic and laboratory data are hyponatremia.      Fayrene Helper MD [facility]  How to contact the [facility] Attending or Consulting provider [place] or covering provider during after hours [place], for this patient?   1. Check the care team in [facility] and look for Semaje Kinker) attending/consulting TRH provider listed and b) the [facility] team listed 2. Log into [email or web address] and use [place] universal password to access. If you do not have the password, please contact the hospital operator. 3. Locate the [facility] provider you are looking for under [facility] and page to Luvina Poirier number that you can be directly reached. 4. If you still have difficulty reaching the provider, please page the [facility] (Director on Call) for the Hospitalists listed on amion for  assistance.  06/17/2020, 5:00 PM

## 2020-06-17 NOTE — ED Provider Notes (Signed)
MSE was initiated and I personally evaluated the patient and placed orders (if any) at  6:52 AM on [place] 8, 2022.  The patient appears stable so that the remainder of the MSE may be completed by another provider.  Headaches and n/v x 4 days.  Seen and had a negative head CT already but nausea and vomiting persist.   NCAT PERRL RRR CTAB NABS, soft  Labs placed in EPID to be seen by am attending    Dvonte Gatliff, MD 06/17/20 [phone]

## 2020-06-18 DIAGNOSIS — E785 Hyperlipidemia, unspecified: Secondary | ICD-10-CM | POA: Diagnosis present

## 2020-06-18 DIAGNOSIS — K219 Gastro-esophageal reflux disease without esophagitis: Secondary | ICD-10-CM | POA: Diagnosis present

## 2020-06-18 DIAGNOSIS — Z20822 Contact with and (suspected) exposure to covid-19: Secondary | ICD-10-CM | POA: Diagnosis present

## 2020-06-18 DIAGNOSIS — E861 Hypovolemia: Secondary | ICD-10-CM | POA: Diagnosis present

## 2020-06-18 DIAGNOSIS — E871 Hypo-osmolality and hyponatremia: Secondary | ICD-10-CM | POA: Diagnosis present

## 2020-06-18 DIAGNOSIS — K449 Diaphragmatic hernia without obstruction or gangrene: Secondary | ICD-10-CM | POA: Diagnosis present

## 2020-06-18 DIAGNOSIS — I10 Essential (primary) hypertension: Secondary | ICD-10-CM | POA: Diagnosis present

## 2020-06-18 DIAGNOSIS — Z8249 Family history of ischemic heart disease and other diseases of the circulatory system: Secondary | ICD-10-CM | POA: Diagnosis not present

## 2020-06-18 DIAGNOSIS — E119 Type 2 diabetes mellitus without complications: Secondary | ICD-10-CM | POA: Diagnosis present

## 2020-06-18 DIAGNOSIS — E860 Dehydration: Secondary | ICD-10-CM | POA: Diagnosis present

## 2020-06-18 DIAGNOSIS — R001 Bradycardia, unspecified: Secondary | ICD-10-CM | POA: Diagnosis present

## 2020-06-18 DIAGNOSIS — G4700 Insomnia, unspecified: Secondary | ICD-10-CM | POA: Diagnosis present

## 2020-06-18 DIAGNOSIS — Z79899 Other long term (current) drug therapy: Secondary | ICD-10-CM | POA: Diagnosis not present

## 2020-06-18 DIAGNOSIS — R112 Nausea with vomiting, unspecified: Secondary | ICD-10-CM | POA: Diagnosis present

## 2020-06-18 LAB — CBC
HCT: 33.5 % — ABNORMAL LOW (ref 36.0–46.0)
Hemoglobin: 11.5 g/dL — ABNORMAL LOW (ref 12.0–15.0)
MCH: 29.3 pg (ref 26.0–34.0)
MCHC: 34.3 g/dL (ref 30.0–36.0)
MCV: 85.5 fL (ref 80.0–100.0)
Platelets: 209 10*3/uL (ref 150–400)
RBC: 3.92 MIL/uL (ref 3.87–5.11)
RDW: 11.8 % (ref 11.5–15.5)
WBC: 8.1 10*3/uL (ref 4.0–10.5)
nRBC: 0 % (ref 0.0–0.2)

## 2020-06-18 LAB — BASIC METABOLIC PANEL
Anion gap: 7 (ref 5–15)
BUN: 16 mg/dL (ref 8–23)
CO2: 25 mmol/L (ref 22–32)
Calcium: 8.7 mg/dL — ABNORMAL LOW (ref 8.9–10.3)
Chloride: 99 mmol/L (ref 98–111)
Creatinine, Ser: 0.83 mg/dL (ref 0.44–1.00)
GFR, Estimated: >60 mL/min (ref >60)
Glucose, Bld: 150 mg/dL — ABNORMAL HIGH (ref 70–99)
Potassium: 3.7 mmol/L (ref 3.5–5.1)
Sodium: 131 mmol/L — ABNORMAL LOW (ref 135–145)

## 2020-06-18 LAB — COMPREHENSIVE METABOLIC PANEL
ALT: 16 U/L (ref 0–44)
AST: 16 U/L (ref 15–41)
Albumin: 3.3 g/dL — ABNORMAL LOW (ref 3.5–5.0)
Alkaline Phosphatase: 59 U/L (ref 38–126)
Anion gap: 9 (ref 5–15)
BUN: 19 mg/dL (ref 8–23)
CO2: 24 mmol/L (ref 22–32)
Calcium: 8.6 mg/dL — ABNORMAL LOW (ref 8.9–10.3)
Chloride: 91 mmol/L — ABNORMAL LOW (ref 98–111)
Creatinine, Ser: 0.86 mg/dL (ref 0.44–1.00)
GFR, Estimated: >60 mL/min (ref >60)
Glucose, Bld: 110 mg/dL — ABNORMAL HIGH (ref 70–99)
Potassium: 3.6 mmol/L (ref 3.5–5.1)
Sodium: 124 mmol/L — ABNORMAL LOW (ref 135–145)
Total Bilirubin: 0.6 mg/dL (ref 0.3–1.2)
Total Protein: 6 g/dL — ABNORMAL LOW (ref 6.5–8.1)

## 2020-06-18 LAB — GLUCOSE, CAPILLARY
Glucose-Capillary: 174 mg/dL — ABNORMAL HIGH (ref 70–99)
Glucose-Capillary: 172 mg/dL — ABNORMAL HIGH (ref 70–99)
Glucose-Capillary: 111 mg/dL — ABNORMAL HIGH (ref 70–99)
Glucose-Capillary: 124 mg/dL — ABNORMAL HIGH (ref 70–99)

## 2020-06-18 LAB — HEMOGLOBIN A1C
Hgb A1c MFr Bld: 6.3 % — ABNORMAL HIGH (ref 4.8–5.6)
Mean Plasma Glucose: 134.11 mg/dL

## 2020-06-18 LAB — OSMOLALITY, URINE: Osmolality, Ur: 188 mOsm/kg — ABNORMAL LOW (ref 300–900)

## 2020-06-18 MED ORDER — PANTOPRAZOLE SODIUM 40 MG PO TBEC
40.0000 mg | DELAYED_RELEASE_TABLET | Freq: Two times a day (BID) | ORAL | Status: DC
  Administered 2020-06-18 – 2020-06-19 (×3): 40 mg via ORAL
  Filled 2020-06-18 (×4): qty 1

## 2020-06-18 MED ORDER — SODIUM CHLORIDE 0.9 % IV SOLN: INTRAVENOUS | Status: AC

## 2020-06-18 NOTE — Progress Notes (Addendum)
PROGRESS NOTE    Elizabeth [name]  ZOX:[id number] DOB: September 20, 1955 DOA: 06/17/2020 PCP: Sharlene Dory, DO   Chief Complaint  Patient presents with  . Headache   Brief Narrative:  Kennesha Brewbaker is Danaye Sobh 65 y.o. female with medical history significant of diabetes, hypertension presenting with headaches, nausea, vomiting, and weakness.  Admitted with hyponatremia in setting of nausea and reflux symptoms.    Assessment & Plan:   Active Problems:   Hyponatremia   Hypovolemia  Hypovolemic Hyponatremia Presented with Na 117 in setting of 6-7 days N/V and inability to take PO In addition on thiazide and spironolactone (on lasix prn) She's corrected rapidly with 1 L NS, supporting concern for hypovolemia Urine sodium < 20 supports hypovolemia (sodium avid state) Normal saline at 100 cc/hr, repeat BMP this afternoon Urine sodium 12.  Pending urine osm (188) and serum osm (267). Normal TSH - 1.552 Not on any SSRI/SNRI  Nausea  Vomiting  Gastroesophageal Reflux Disease 2-3 months of severe reflux symptoms - she's very careful with diet Worsened over past 6-7 days Esophagram with hiatal hernia with moderate GERD, negative for stricture RD for education for GERD PPI BID.  Hold carafate.   If able to tolerate PO here, can plan for discharge for outpatient GI follow up.  If unable to tolerate PO, will need inpatient GI consult.  Dr. Carmelia Roller placed GI consult on 4/5.   She seems to be improving, though will need to ensure she's able to maintain PO intake  Hypertension Hold chlorthalidone and spironolactone due to hyponatremia Hold lasix (this is as needed medicine at home) Continue amlodipine and metoprolol prn Continue tenex  Bradycardia Noted last night, appeared to be asymptomatic  T2DM:  Ssi, A1c within last 3 months was 6.9  Dyslipidemia Continue lipitor  Insomnia Likely 2/2 above nausea/vomiting/reflux symptoms Continue trazodone Needs sleep study  outpatient  Headache Improved, follow CT 4/5 without acute findings  Dizziness Improved, follow Orthostatics - negative  DVT prophylaxis: SCD Code Status: full  Family Communication: none at bedside Disposition:   Status is: Observation  The patient will require care spanning > 2 midnights and should be moved to inpatient because: Inpatient level of care appropriate due to severity of illness  Dispo: The patient is from: Home              Anticipated d/c is to: Home              Patient currently is not medically stable to d/c.   Difficult to place patient No       Consultants:   none  Procedures:  none  Antimicrobials: Anti-infectives (From admission, onward)   None     Subjective: Feels better Says she had subway sandwich   Objective: Vitals:   06/18/20 0514 06/18/20 0819 06/18/20 0824 06/18/20 0830  BP: 123/62 (!) 134/53 (!) 143/59 129/62  Pulse: 71 67 75 90  Resp: 17     Temp: 98 F (36.7 C)     TempSrc: Oral     SpO2: 100%     Weight:      Height:        Intake/Output Summary (Last 24 hours) at 06/18/2020 0844 Last data filed at 06/18/2020 0514 Gross per 24 hour  Intake 360 ml  Output 600 ml  Net -240 ml   Filed Weights   06/17/20 0652 06/17/20 0815  Weight: 73.7 kg 73.7 kg    Examination:  General exam: Appears calm and comfortable  Respiratory system:  unlabored Cardiovascular system: RRR Gastrointestinal system: Abdomen is nondistended, soft and nontender.  Central nervous system: Alert and oriented. No focal neurological deficits. Extremities: no LEE Skin: No rashes, lesions or ulcers Psychiatry: Judgement and insight appear normal. Mood & affect appropriate.     Data Reviewed: I have personally reviewed following labs and imaging studies  CBC: Recent Labs  Lab 06/17/20 0704 06/18/20 0423  WBC 6.7 8.1  NEUTROABS 4.5  --   HGB 13.2 11.5*  HCT 36.9 33.5*  MCV 81.1 85.5  PLT 238 209    Basic Metabolic Panel: Recent  Labs  Lab 06/17/20 0704 06/17/20 1427 06/18/20 0423  NA 117* 127* 124*  K 3.2* 3.6 3.6  CL 82* 93* 91*  CO2 24 23 24   GLUCOSE 184* 132* 110*  BUN 14 15 19   CREATININE 0.89 0.84 0.86  CALCIUM 9.7 9.2 8.6*    GFR: Estimated Creatinine Clearance: 59.3 mL/min (by C-G formula based on SCr of 0.86 mg/dL).  Liver Function Tests: Recent Labs  Lab 06/17/20 0704 06/18/20 0423  AST 25 16  ALT 19 16  ALKPHOS 59 59  BILITOT 0.7 0.6  PROT 7.9 6.0*  ALBUMIN 4.1 3.3*    CBG: Recent Labs  Lab 06/17/20 1651 06/17/20 2053 06/18/20 0748  GLUCAP 203* 91 174*     Recent Results (from the past 240 hour(s))  Resp Panel by RT-PCR (Flu Del Wiseman&B, Covid) Nasopharyngeal Swab     Status: None   Collection Time: 06/17/20  8:09 AM   Specimen: Nasopharyngeal Swab; Nasopharyngeal(NP) swabs in vial transport medium  Result Value Ref Range Status   SARS Coronavirus 2 by RT PCR NEGATIVE NEGATIVE Final    Comment: (NOTE) SARS-CoV-2 target nucleic acids are NOT DETECTED.  The SARS-CoV-2 RNA is generally detectable in upper respiratory specimens during the acute phase of infection. The lowest concentration of SARS-CoV-2 viral copies this assay can detect is 138 copies/mL. Adanely Reynoso negative result does not preclude SARS-Cov-2 infection and should not be used as the sole basis for treatment or other patient management decisions. Caden Fatica negative result may occur with  improper specimen collection/handling, submission of specimen other than nasopharyngeal swab, presence of viral mutation(s) within the areas targeted by this assay, and inadequate number of viral copies(<138 copies/mL). Jamie-Lee Galdamez negative result must be combined with clinical observations, patient history, and epidemiological information. The expected result is Negative.  Fact Sheet for Patients:  08/18/20  Fact Sheet for Healthcare Providers:  08/17/20  This test is no t yet approved  or cleared by the [employer] FDA and  has been authorized for detection and/or diagnosis of SARS-CoV-2 by FDA under an Emergency Use Authorization (EUA). This EUA will remain  in effect (meaning this test can be used) for the duration of the COVID-19 declaration under Section 564(b)(1) of the Act, 21 U.S.C.section 360bbb-3(b)(1), unless the authorization is terminated  or revoked sooner.       Influenza Azile Minardi by PCR NEGATIVE NEGATIVE Final   Influenza B by PCR NEGATIVE NEGATIVE Final    Comment: (NOTE) The Xpert Xpress SARS-CoV-2/FLU/RSV plus assay is intended as an aid in the diagnosis of influenza from Nasopharyngeal swab specimens and should not be used as Fayez Sturgell sole basis for treatment. Nasal washings and aspirates are unacceptable for Xpert Xpress SARS-CoV-2/FLU/RSV testing.  Fact Sheet for Patients: SeriousBroker.it  Fact Sheet for Healthcare Providers: [place]  This test is not yet approved or cleared by the [employer] FDA and has been authorized for detection and/or diagnosis of  SARS-CoV-2 by FDA under an Emergency Use Authorization (EUA). This EUA will remain in effect (meaning this test can be used) for the duration of the COVID-19 declaration under Section 564(b)(1) of the Act, 21 U.S.C. section 360bbb-3(b)(1), unless the authorization is terminated or revoked.  Performed at [facility], [address] [place], [place] [address]          Radiology Studies: DG ESOPHAGUS W DOUBLE CM (HD)  Result Date: 06/17/2020 CLINICAL DATA:  Nausea and vomiting.  Acid reflux. EXAM: ESOPHOGRAM / BARIUM SWALLOW / BARIUM TABLET STUDY TECHNIQUE: Combined double contrast and single contrast examination performed using effervescent crystals, thick barium liquid, and thin barium liquid. The patient was observed with fluoroscopy swallowing Arfa Lamarca 13 mm barium sulphate tablet. FLUOROSCOPY TIME:  Fluoroscopy Time:  1  minutes 0 seconds Radiation Exposure Index (if provided by the fluoroscopic device): Number of Acquired Spot Images: 7 COMPARISON:  None. FINDINGS: Normal pharyngeal swallowing. Esophageal mucosa and motility normal. No stricture or mass. Moderate hiatal hernia. Moderate gastroesophageal reflux to the midesophagus. Barium tablet passed readily in the stomach.  No stricture. IMPRESSION: Hiatal hernia with moderate gastroesophageal reflux. Negative for stricture. Electronically Signed   By: Marlan Palau M.D.   On: 06/17/2020 15:57        Scheduled Meds: . amLODipine  5 mg Oral BID  . atorvastatin  40 mg Oral Daily  . carvedilol  25 mg Oral BID WC  . guanFACINE  2 mg Oral QHS  . insulin aspart  0-5 Units Subcutaneous QHS  . insulin aspart  0-9 Units Subcutaneous TID WC  . pantoprazole (PROTONIX) IV  40 mg Intravenous Q12H  . polyethylene glycol  17 g Oral Daily   Continuous Infusions: . sodium chloride       LOS: 0 days    Time spent: over 30 min    Lacretia Nicks, MD [facility]   To contact the attending provider between 7A-7P or the covering provider during after hours 7P-7A, please log into the web site [email or web address] and access using universal [place] password for that web site. If you do not have the password, please call the hospital operator.  06/18/2020, 8:44 AM

## 2020-06-19 LAB — IRON AND TIBC
Iron: 64 ug/dL (ref 28–170)
Saturation Ratios: 18 % (ref 10.4–31.8)
TIBC: 362 ug/dL (ref 250–450)
UIBC: 298 ug/dL

## 2020-06-19 LAB — COMPREHENSIVE METABOLIC PANEL
ALT: 17 U/L (ref 0–44)
AST: 19 U/L (ref 15–41)
Albumin: 3.5 g/dL (ref 3.5–5.0)
Alkaline Phosphatase: 64 U/L (ref 38–126)
Anion gap: 11 (ref 5–15)
BUN: 19 mg/dL (ref 8–23)
CO2: 20 mmol/L — ABNORMAL LOW (ref 22–32)
Calcium: 8.7 mg/dL — ABNORMAL LOW (ref 8.9–10.3)
Chloride: 103 mmol/L (ref 98–111)
Creatinine, Ser: 0.82 mg/dL (ref 0.44–1.00)
GFR, Estimated: >60 mL/min (ref >60)
Glucose, Bld: 152 mg/dL — ABNORMAL HIGH (ref 70–99)
Potassium: 3.7 mmol/L (ref 3.5–5.1)
Sodium: 134 mmol/L — ABNORMAL LOW (ref 135–145)
Total Bilirubin: 0.4 mg/dL (ref 0.3–1.2)
Total Protein: 6.7 g/dL (ref 6.5–8.1)

## 2020-06-19 LAB — CBC WITH DIFFERENTIAL/PLATELET
Abs Immature Granulocytes: 0.02 10*3/uL (ref 0.00–0.07)
Basophils Absolute: 0 10*3/uL (ref 0.0–0.1)
Basophils Relative: 0 %
Eosinophils Absolute: 0.1 10*3/uL (ref 0.0–0.5)
Eosinophils Relative: 1 %
HCT: 37.6 % (ref 36.0–46.0)
Hemoglobin: 12.2 g/dL (ref 12.0–15.0)
Immature Granulocytes: 0 %
Lymphocytes Relative: 19 %
Lymphs Abs: 1.3 10*3/uL (ref 0.7–4.0)
MCH: 29 pg (ref 26.0–34.0)
MCHC: 32.4 g/dL (ref 30.0–36.0)
MCV: 89.5 fL (ref 80.0–100.0)
Monocytes Absolute: 0.5 10*3/uL (ref 0.1–1.0)
Monocytes Relative: 8 %
Neutro Abs: 4.8 10*3/uL (ref 1.7–7.7)
Neutrophils Relative %: 72 %
Platelets: 155 10*3/uL (ref 150–400)
RBC: 4.2 MIL/uL (ref 3.87–5.11)
RDW: 12.2 % (ref 11.5–15.5)
WBC: 6.7 10*3/uL (ref 4.0–10.5)
nRBC: 0 % (ref 0.0–0.2)

## 2020-06-19 LAB — GLUCOSE, CAPILLARY
Glucose-Capillary: 119 mg/dL — ABNORMAL HIGH (ref 70–99)
Glucose-Capillary: 144 mg/dL — ABNORMAL HIGH (ref 70–99)

## 2020-06-19 LAB — MAGNESIUM: Magnesium: 1.8 mg/dL (ref 1.7–2.4)

## 2020-06-19 LAB — FERRITIN: Ferritin: 78 ng/mL (ref 11–307)

## 2020-06-19 LAB — PHOSPHORUS: Phosphorus: 2.9 mg/dL (ref 2.5–4.6)

## 2020-06-19 LAB — FOLATE: Folate: 10.7 ng/mL (ref >5.9)

## 2020-06-19 LAB — VITAMIN B12: Vitamin B-12: 222 pg/mL (ref 180–914)

## 2020-06-19 MED ORDER — POLYETHYLENE GLYCOL 3350 17 G PO PACK: 17.0000 g | PACK | Freq: Every day | ORAL | 0 refills | Status: DC

## 2020-06-19 MED ORDER — PANTOPRAZOLE SODIUM 40 MG PO TBEC: 40.0000 mg | DELAYED_RELEASE_TABLET | Freq: Two times a day (BID) | ORAL | 0 refills | Status: DC

## 2020-06-19 NOTE — Discharge Instructions (Signed)
Gastroesophageal Reflux Disease (GERD) Nutrition Therapy Nutrition therapy makes sure that the food you eat will improve your health and help control your symptoms.  Lifestyle Tips Exercise at least three or four times each week. Wear loose-fitting clothes. Do not smoke. Raise the head of your bed 6 to 9 inches. You can put a foam wedge under the top part of the mattress or prop up the legs on the head of the bed with wooden blocks. (Stacking pillows is not effective.) Wait 3 hours after eating before lying down. Eat several small meals throughout the day. Eat in a calm, relaxed place. Sit down while you eat.  Foods Recommended Milk and Milk Products Buttermilk Evaporated skim milk Fat-free (skim) or low-fat (1%) milk Soy milk Nonfat or low-fat yogurt Powdered milk Nonfat or low-fat cheeses Low-fat ice cream Sherbet Meat and Other Protein Foods  Tender, well-cooked lean meat, poultry, fish, eggs, or soy prepared without added fat Dried beans and peas Grains Any prepared without added fat; choose whole grains for at least half of your grain servings. Vegetables Any prepared without added fat. Eat a variety of vegetables, especially green and orange ones. Fruits Any prepared without added fat. Eat a variety of fruits. Fats Limit to less than 8 teaspoons per day; heart-healthy vegetable oils such as olive or canola oil. Beverages Caffeine-free herbal teas except those made with peppermint or spearmint Other All condiments except pepper  Foods Not Recommended It is recommended that a trial of limiting or eliminating the following foods may reduce the symptoms of GERD: Peppermint and spearmint Chocolate Alcohol Caffeinated beverages (regular tea, coffee, colas, energy drinks, other caffeinated soft drinks) Decaffeinated coffee and decaffeinated regular tea Peppermint and spearmint tea Pepper High-fat foods, including: Reduced-fat (2%) milk, whole milk, cream, high-fat cheeses,  high-fat yogurt, chocolate milk, cocoa Fried meats, bacon, sausage, pepperoni, salami, bologna, frankfurters/hot dogs Other fried foods (doughnuts, french toast, french fries, deep-fried vegetables) Nuts and nut butters Pastries and other high-fat desserts More than 8 teaspoons of oil, butter, shortening per day Any fruits or vegetables that cause symptoms. (These will vary from person to person.)

## 2020-06-19 NOTE — Progress Notes (Signed)
Reviewed written d/c instructions w pt and her husband and all questions answered, they verbalize understanding. Pt d/c'ed via w/c with all belongings in stable condition.

## 2020-06-19 NOTE — Discharge Summary (Addendum)
Physician Discharge Summary  Sherral Dirocco ZOX:[id number] DOB: 07-18-1955 DOA: 06/17/2020  PCP: Shelda Pal, DO  Admit date: 06/17/2020 Discharge date: 06/19/2020  Time spent: 40 minutes  Recommendations for Outpatient Follow-up:  1. Follow outpatient CBC/CMP 2. Follow reflux symptoms outpatient - needs outpatient follow up with gastroenterology for reflux and hiatal hernia (referral to GI made previously by Dr. Nani Ravens)  3. Follow sodium outpatient - thiazide diuretic, k sparing diuretic discontinued for now - prn loop diuretic also discontinued - follow outpatient 4. Follow blood pressure - BP reasonable while here off chlorthalidone, spironolactone, losartan, (lasix prn) - follow outpatient - adjust as needed, caution with diuretics 5. A1c 6.3, follow outpatient  6. Follow insomnia outpatient, needs sleep study outpatient   Discharge Diagnoses:  Active Problems:   Hyponatremia   Hypovolemia   Discharge Condition: stable  Diet recommendation: heart healthy  Filed Weights   06/17/20 0652 06/17/20 0815  Weight: 73.7 kg 73.7 kg    History of present illness:  Elizabeth [name] 65 y.o.femalewith medical history significant ofdiabetes, hypertension presenting with headaches, nausea, vomiting, and weakness. Admitted with hyponatremia in setting of nausea and reflux symptoms.    She's improved with IVF and BID protonix.  Esophagram showed moderate hiatal hernia with moderate gastroesophageal reflux to midesophagus.  Plan for outpatient GI follow up.   See below for additional details   Hospital Course:  Hypovolemic Hyponatremia Presented with Na 117 in setting of 6-7 days N/V and inability to take PO In addition on thiazide and spironolactone (on lasix prn at home) Corrected with IVF and d/c diuretics - 134 on day of d/c, continue to hold thiazide, spironolactone, lasix on discharge Urine sodium < 20 supported hypovolemia (sodium avid state) Urine sodium 12.  Pending urine osm (188) and serum osm (267). Normal TSH - 1.552 Not on any SSRI/SNRI  Nausea  Vomiting  Gastroesophageal Reflux Disease 2-3 months of severe reflux symptoms - she's very careful with diet Worsened over past 6-7 days Esophagram with moderate hiatal hernia with moderate GERD, negative for stricture RD for education for GERD PPI BID. Hold carafate.  She's tolerating PO with BID PPI.  Continue at discharge, follow outpatient with PCP/GI.   Hypertension Hold chlorthalidone and spironolactone due to hyponatremia Hold lasix (this is as needed medicine at home) Continue amlodipine and metoprolol prn Continue tenex BP appropriate while holding chlorthalidone, spironolactone, losartan - continue to hold these on discharge  Bradycardia Asymptomatic, resolved  T2DM:  Ssi, A1c 6.3  Dyslipidemia Continue lipitor  Insomnia Likely 2/2 above nausea/vomiting/reflux symptoms Continue trazodone Needs sleep study outpatient - family concerned about OSA  Headache Improved, follow CT 4/5 without acute findings Likely related to dehydration   Dizziness Improved, follow Likely related to dehydration Orthostatics - negative  Procedures:  none  Consultations:  none  Discharge Exam: Vitals:   06/18/20 2106 06/19/20 0508  BP: 130/65 (!) 123/59  Pulse: 61 74  Resp: 14 16  Temp: 98 F (36.7 C) 97.6 F (36.4 C)  SpO2: 99% 100%   Declined interpreter No new complaints Feels better, tolerating PO Discussed d/c plan with pt and husband   General: No acute distress. Cardiovascular: Heart sounds show Louden Houseworth regular rate, and rhythm Lungs: Clear to auscultation bilaterally Abdomen: Soft, nontender, nondistended  Neurological: Alert and oriented 3. Moves all extremities 4. Cranial nerves II through XII grossly intact. Skin: Warm and dry. No rashes or lesions. Extremities: No clubbing or cyanosis. No edema Discharge Instructions   Discharge Instructions  Call MD for:  difficulty breathing, headache or visual disturbances   Complete by: As directed    Call MD for:  extreme fatigue   Complete by: As directed    Call MD for:  hives   Complete by: As directed    Call MD for:  persistant dizziness or light-headedness   Complete by: As directed    Call MD for:  persistant nausea and vomiting   Complete by: As directed    Call MD for:  redness, tenderness, or signs of infection (pain, swelling, redness, odor or green/yellow discharge around incision site)   Complete by: As directed    Call MD for:  severe uncontrolled pain   Complete by: As directed    Call MD for:  temperature >100.4   Complete by: As directed    Diet - low sodium heart healthy   Complete by: As directed    Discharge instructions   Complete by: As directed    You were seen for nausea and reflux symptoms with hyponatremia (low sodium levels).  Your sodium levels improved with IV fluids.  Chlorthalidone, spironolactone, and lasix can affect your sodium levels.  Stop these medications for now and follow up your blood pressures with your PCP.   Your blood pressure has been ok off of chlorthalidone, spironolactone, lasix, and losartan.  Continue to hold these medicines when you go home.  Please follow up with your PCP for further management/adjustment of your blood pressure medications.  Continue your coreg and amlodipine as prescribed.  For your reflux, you have Claudene Gatliff hiatal hernia which is likely making your reflux worse.  We'll send you home with protonix to take twice daily.  Please follow up with gastroenterology as an outpatient.   Your lightheadedness or dizziness was probably related to dehydration and poor oral intake.  Change positions slowly, if you're having issues keeping up with your nutritional needs, please return to care.  Return for new, recurrent, or worsening symptoms.  Please ask your PCP to request records from this hospitalization so they know what was done  and what the next steps will be.   Discharge patient   Complete by: As directed    Discharge disposition: 01-Home or Self Care   Discharge patient date: 06/19/2020   Increase activity slowly   Complete by: As directed      Allergies as of 06/19/2020   No Known Allergies     Medication List    STOP taking these medications   chlorthalidone 25 MG tablet Commonly known as: HYGROTON   esomeprazole 40 MG capsule Commonly known as: NexIUM   furosemide 40 MG tablet Commonly known as: LASIX   losartan 100 MG tablet Commonly known as: COZAAR   potassium chloride 10 MEQ tablet Commonly known as: KLOR-CON   spironolactone 25 MG tablet Commonly known as: ALDACTONE   sucralfate 1 g tablet Commonly known as: Carafate     TAKE these medications   amLODipine 5 MG tablet Commonly known as: NORVASC TAKE 1 TABLET(5 MG) BY MOUTH TWICE DAILY OR AS DIRECTED What changed: See the new instructions.   atorvastatin 40 MG tablet Commonly known as: LIPITOR TAKE 1 TABLET(40 MG) BY MOUTH DAILY   blood glucose meter kit and supplies Kit daily as needed. Dispense based on patient and insurance preference. Use up to once  daily as directed.   carvedilol 25 MG tablet Commonly known as: COREG Take 1 tablet (25 mg total) by mouth 2 (two) times daily with Corinda Ammon  meal.   cyclobenzaprine 10 MG tablet Commonly known as: FLEXERIL Take 0.5-1 tablets (5-10 mg total) by mouth 3 (three) times daily as needed for muscle spasms.   glucose blood test strip Commonly known as: IGlucose Test Strips Dispense based on patient and insurance preference. Test once daily to check blood sugar.   guanFACINE 1 MG tablet Commonly known as: TENEX TAKE 2 TABLETS(2 MG) BY MOUTH EVERY NIGHT   Lancets Misc Dispense based on patient and insurance preference.  Test once daily to check blood sugar.   metoprolol tartrate 50 MG tablet Commonly known as: LOPRESSOR Take 1 tablet by mouth daily as needed if BP is  150-180 What changed:   how much to take  how to take this  when to take this   ondansetron 4 MG disintegrating tablet Commonly known as: ZOFRAN-ODT Take 1 tablet (4 mg total) by mouth every 8 (eight) hours as needed for nausea or vomiting.   pantoprazole 40 MG tablet Commonly known as: Protonix Take 1 tablet (40 mg total) by mouth 2 (two) times daily.   polyethylene glycol 17 g packet Commonly known as: MIRALAX / GLYCOLAX Take 17 g by mouth daily. Start taking on: June 20, 2020   promethazine 25 MG tablet Commonly known as: PHENERGAN Take 1 tablet (25 mg total) by mouth every 8 (eight) hours as needed for nausea or vomiting.   traZODone 50 MG tablet Commonly known as: DESYREL Take 1/2-1 tablet (25-50 mg total) by mouth at bedtime as needed for sleep.      No Known Allergies    The results of significant diagnostics from this hospitalization (including imaging, microbiology, ancillary and laboratory) are listed below for reference.    Significant Diagnostic Studies: CT Head Wo Contrast  Result Date: 06/14/2020 CLINICAL DATA:  Headache and dizziness. EXAM: CT HEAD WITHOUT CONTRAST TECHNIQUE: Contiguous axial images were obtained from the base of the skull through the vertex without intravenous contrast. COMPARISON:  None. FINDINGS: Brain: The ventricles are normal in size and configuration. No extra-axial fluid collections are identified. The gray-white differentiation is maintained. No CT findings for acute hemispheric infarction or intracranial hemorrhage. No mass lesions. The brainstem and cerebellum are normal. Vascular: Minimal vascular calcifications. No hyperdense vessels or obvious aneurysm. Skull: No acute skull fracture.  No bone lesion. Sinuses/Orbits: The paranasal sinuses and mastoid air cells are clear. The globes are intact. Other: No scalp lesions, laceration or hematoma. IMPRESSION: No acute intracranial findings or mass lesions. Electronically Signed   By: Marijo Sanes M.D.   On: 06/14/2020 14:37   DG ESOPHAGUS W DOUBLE CM (HD)  Result Date: 06/17/2020 CLINICAL DATA:  Nausea and vomiting.  Acid reflux. EXAM: ESOPHOGRAM / BARIUM SWALLOW / BARIUM TABLET STUDY TECHNIQUE: Combined double contrast and single contrast examination performed using effervescent crystals, thick barium liquid, and thin barium liquid. The patient was observed with fluoroscopy swallowing Jurrell Royster 13 mm barium sulphate tablet. FLUOROSCOPY TIME:  Fluoroscopy Time:  1 minutes 0 seconds Radiation Exposure Index (if provided by the fluoroscopic device): Number of Acquired Spot Images: 7 COMPARISON:  None. FINDINGS: Normal pharyngeal swallowing. Esophageal mucosa and motility normal. No stricture or mass. Moderate hiatal hernia. Moderate gastroesophageal reflux to the midesophagus. Barium tablet passed readily in the stomach.  No stricture. IMPRESSION: Hiatal hernia with moderate gastroesophageal reflux. Negative for stricture. Electronically Signed   By: Franchot Gallo M.D.   On: 06/17/2020 15:57    Microbiology: Recent Results (from the past 240 hour(s))  Resp Panel  by RT-PCR (Flu Chandel Zaun&B, Covid) Nasopharyngeal Swab     Status: None   Collection Time: 06/17/20  8:09 AM   Specimen: Nasopharyngeal Swab; Nasopharyngeal(NP) swabs in vial transport medium  Result Value Ref Range Status   SARS Coronavirus 2 by RT PCR NEGATIVE NEGATIVE Final    Comment: (NOTE) SARS-CoV-2 target nucleic acids are NOT DETECTED.  The SARS-CoV-2 RNA is generally detectable in upper respiratory specimens during the acute phase of infection. The lowest concentration of SARS-CoV-2 viral copies this assay can detect is 138 copies/mL. Dyanara Cozza negative result does not preclude SARS-Cov-2 infection and should not be used as the sole basis for treatment or other patient management decisions. Tambi Thole negative result may occur with  improper specimen collection/handling, submission of specimen other than nasopharyngeal swab, presence of  viral mutation(s) within the areas targeted by this assay, and inadequate number of viral copies(<138 copies/mL). Yesennia Hirota negative result must be combined with clinical observations, patient history, and epidemiological information. The expected result is Negative.  Fact Sheet for Patients:  EntrepreneurPulse.com.au  Fact Sheet for Healthcare Providers:  IncredibleEmployment.be  This test is no t yet approved or cleared by the [place] FDA and  has been authorized for detection and/or diagnosis of SARS-CoV-2 by FDA under an Emergency Use Authorization (EUA). This EUA will remain  in effect (meaning this test can be used) for the duration of the COVID-19 declaration under Section 564(b)(1) of the Act, 21 U.S.C.section 360bbb-3(b)(1), unless the authorization is terminated  or revoked sooner.       Influenza Nelton Amsden by PCR NEGATIVE NEGATIVE Final   Influenza B by PCR NEGATIVE NEGATIVE Final    Comment: (NOTE) The Xpert Xpress SARS-CoV-2/FLU/RSV plus assay is intended as an aid in the diagnosis of influenza from Nasopharyngeal swab specimens and should not be used as Meekah Math sole basis for treatment. Nasal washings and aspirates are unacceptable for Xpert Xpress SARS-CoV-2/FLU/RSV testing.  Fact Sheet for Patients: EntrepreneurPulse.com.au  Fact Sheet for Healthcare Providers: IncredibleEmployment.be  This test is not yet approved or cleared by the [place] FDA and has been authorized for detection and/or diagnosis of SARS-CoV-2 by FDA under an Emergency Use Authorization (EUA). This EUA will remain in effect (meaning this test can be used) for the duration of the COVID-19 declaration under Section 564(b)(1) of the Act, 21 U.S.C. section 360bbb-3(b)(1), unless the authorization is terminated or revoked.  Performed at [facility], [address] [place], [place] [address]      Labs: Basic  Metabolic Panel: Recent Labs  Lab 06/17/20 0704 06/17/20 1427 06/18/20 0423 06/18/20 1533 06/19/20 0455  NA 117* 127* 124* 131* 134*  K 3.2* 3.6 3.6 3.7 3.7  CL 82* 93* 91* 99 103  CO2 '24 23 24 25 ' 20*  GLUCOSE 184* 132* 110* 150* 152*  BUN '14 15 19 16 19  ' CREATININE 0.89 0.84 0.86 0.83 0.82  CALCIUM 9.7 9.2 8.6* 8.7* 8.7*  MG  --   --   --   --  1.8  PHOS  --   --   --   --  2.9   Liver Function Tests: Recent Labs  Lab 06/17/20 0704 06/18/20 0423 06/19/20 0455  AST '25 16 19  ' ALT '19 16 17  ' ALKPHOS 59 59 64  BILITOT 0.7 0.6 0.4  PROT 7.9 6.0* 6.7  ALBUMIN 4.1 3.3* 3.5   No results for input(s): LIPASE, AMYLASE in the last 168 hours. No results for input(s): AMMONIA in the last 168  hours. CBC: Recent Labs  Lab 06/17/20 0704 06/18/20 0423 06/19/20 0455  WBC 6.7 8.1 6.7  NEUTROABS 4.5  --  4.8  HGB 13.2 11.5* 12.2  HCT 36.9 33.5* 37.6  MCV 81.1 85.5 89.5  PLT 238 209 155   Cardiac Enzymes: No results for input(s): CKTOTAL, CKMB, CKMBINDEX, TROPONINI in the last 168 hours. BNP: BNP (last 3 results) No results for input(s): BNP in the last 8760 hours.  ProBNP (last 3 results) No results for input(s): PROBNP in the last 8760 hours.  CBG: Recent Labs  Lab 06/18/20 0748 06/18/20 1151 06/18/20 1700 06/18/20 2107 06/19/20 0721  GLUCAP 174* 172* 111* 124* 119*       Signed:  Fayrene Helper MD.  [facility] 06/19/2020, 10:36 AM

## 2020-06-19 NOTE — Progress Notes (Signed)
NUTRITION NOTE RD working remotely.   Consult received for diet education for reflux. She has noted medical hx of DM and HTN and presented to the ED with headaches, N/V, and weakness.   Noted that patient requires a Nurse, learning disability for [place]. RD is working remotely today.   Discharge summary entered earlier this AM; discharge order not yet entered.  Will place handout from the Academy of [employer] Dietetics about reflux in Discharge Instructions. Of note, this handout is not available in patient's preferred language and will be in [place].   Current diet order is Carb Modified and no intakes have been documented since admission on 4/8.      Trenton Gammon, MS, RD, LDN, CNSC Inpatient Clinical Dietitian RD pager # available in [facility]  After hours/weekend pager # available in [facility]

## 2020-06-20 ENCOUNTER — Telehealth: Payer: Self-pay

## 2020-06-20 NOTE — Telephone Encounter (Signed)
Transition Care Management Follow-up Telephone Call  Date of discharge and from where: 06/19/20-[place]  How have you been since you were released from the hospital? Doing ok, still feels weak per husband.  Any questions or concerns? No  Items Reviewed:  Did the pt receive and understand the discharge instructions provided? Yes   Medications obtained and verified? Yes   Other? Yes   Any new allergies since your discharge? No   Dietary orders reviewed? Yes  Do you have support at home? Yes   Home Care and Equipment/Supplies: Were home health services ordered? no If so, what is the name of the agency? n/a  Has the agency set up a time to come to the patient's home? not applicable Were any new equipment or medical supplies ordered?  No What is the name of the medical supply agency? n/a Were you able to get the supplies/equipment? not applicable Do you have any questions related to the use of the equipment or supplies? n/a  Functional Questionnaire: (I = Independent and D = Dependent) ADLs: I with assistance  Bathing/Dressing-  I with assistance  Meal Prep-  I with assistance  Eating- I  Maintaining continence- I  Transferring/Ambulation-  I with assistance  Managing Meds- I  Follow up appointments reviewed:   PCP Hospital f/u appt confirmed? Yes  Scheduled to see Dr. Carmelia Roller on 06/29/20 @ 7:45am.  Specialist Hospital f/u appt confirmed? No  Not yet scheduled  Are transportation arrangements needed? No   If their condition worsens, is the pt aware to call PCP or go to the Emergency Dept.? Yes  Was the patient provided with contact information for the PCP's office or ED? Yes  Was to pt encouraged to call back with questions or concerns? Yes

## 2020-06-23 ENCOUNTER — Other Ambulatory Visit (HOSPITAL_BASED_OUTPATIENT_CLINIC_OR_DEPARTMENT_OTHER): Payer: Self-pay

## 2020-06-29 ENCOUNTER — Other Ambulatory Visit: Payer: Self-pay | Admitting: Family Medicine

## 2020-06-29 ENCOUNTER — Other Ambulatory Visit: Payer: Self-pay

## 2020-06-29 ENCOUNTER — Encounter: Payer: Self-pay | Admitting: Family Medicine

## 2020-06-29 ENCOUNTER — Ambulatory Visit (INDEPENDENT_AMBULATORY_CARE_PROVIDER_SITE_OTHER): Payer: 59 | Admitting: Family Medicine

## 2020-06-29 VITALS — BP 148/80 | HR 68 | Temp 98.0°F | Ht 60.0 in | Wt 162.2 lb

## 2020-06-29 DIAGNOSIS — I10 Essential (primary) hypertension: Secondary | ICD-10-CM

## 2020-06-29 DIAGNOSIS — E871 Hypo-osmolality and hyponatremia: Secondary | ICD-10-CM | POA: Diagnosis not present

## 2020-06-29 LAB — BASIC METABOLIC PANEL
BUN: 14 mg/dL (ref 6–23)
CO2: 27 mEq/L (ref 19–32)
Calcium: 9.3 mg/dL (ref 8.4–10.5)
Chloride: 105 mEq/L (ref 96–112)
Creatinine, Ser: 0.89 mg/dL (ref 0.40–1.20)
GFR: 68.65 mL/min (ref >60.00)
Glucose, Bld: 134 mg/dL — ABNORMAL HIGH (ref 70–99)
Potassium: 4.2 mEq/L (ref 3.5–5.1)
Sodium: 140 mEq/L (ref 135–145)

## 2020-06-29 LAB — CBC
HCT: 36.3 % (ref 36.0–46.0)
Hemoglobin: 12.2 g/dL (ref 12.0–15.0)
MCHC: 33.6 g/dL (ref 30.0–36.0)
MCV: 87.4 fl (ref 78.0–100.0)
Platelets: 197 10*3/uL (ref 150.0–400.0)
RBC: 4.16 Mil/uL (ref 3.87–5.11)
RDW: 13.1 % (ref 11.5–15.5)
WBC: 5.1 10*3/uL (ref 4.0–10.5)

## 2020-06-29 MED ORDER — OLMESARTAN MEDOXOMIL 20 MG PO TABS: 20.0000 mg | ORAL_TABLET | Freq: Every day | ORAL | 2 refills | Status: DC

## 2020-06-29 MED ORDER — METOPROLOL TARTRATE 100 MG PO TABS: 100.0000 mg | ORAL_TABLET | Freq: Two times a day (BID) | ORAL | 3 refills | Status: DC

## 2020-06-29 NOTE — Patient Instructions (Signed)
Call the gastroenterology team at [phone].  Keep the diet clean and stay active.  Give [place] 2-3 business days to get the results of your labs back.   Keep the diet clean and stay active.  Stay hydrated.  Continue to take your blood pressure at home.   Let us know if you need anything.

## 2020-06-29 NOTE — Progress Notes (Signed)
Chief Complaint  Patient presents with  . Hospitalization Follow-up    Subjective Elizabeth [name] is a 65 y.o. female who presents for hypertension follow up.  She is here with her husband. She does monitor home blood pressures. Blood pressures ranging from 140-160's/90's on average. She is compliant with medications-Norvasc 5 mg twice daily, Coreg 25 mg twice daily, metoprolol as needed.  It seems the metoprolol helps more than anything else. She was taken off the Lasix, losartan, chlorthalidone, and spironolactone while in the hospital. Patient has these side effects of medication: none She is adhering to a healthy diet overall. Current exercise: Walking No chest pain or shortness of breath   Past Medical History:  Diagnosis Date  . Diabetes mellitus type 2 in obese (HCC)   . Hypertension     Exam BP (!) 148/80 (BP Location: Left Arm, Patient Position: Sitting, Cuff Size: Normal)   Pulse 68   Temp 98 F (36.7 C) (Oral)   Ht 5' (1.524 m)   Wt 162 lb 4 oz (73.6 kg)   SpO2 97%   BMI 31.69 kg/m  General:  well developed, well nourished, in no apparent distress Heart: RRR, no bruits, no LE edema Lungs: clear to auscultation, no accessory muscle use Psych: well oriented with normal range of affect and appropriate judgment/insight  Primary hypertension - Plan: metoprolol tartrate (LOPRESSOR) 100 MG tablet, olmesartan (BENICAR) 20 MG tablet, CBC  Hyponatremia - Plan: Basic metabolic panel  Status: Chronic, uncontrolled.  Change Coreg to Lopressor 100 mg twice daily.  Add back olmesartan given the longer half-life.  Continue to monitor blood pressure at home.  Counseled on diet and exercise.  We will check BMP today. F/u in 3 weeks. The patient and her spouse voiced understanding and agreement to the plan.  Jilda Roche [place], DO 06/29/20  11:02 AM

## 2020-07-07 ENCOUNTER — Other Ambulatory Visit (INDEPENDENT_AMBULATORY_CARE_PROVIDER_SITE_OTHER): Payer: 59

## 2020-07-07 ENCOUNTER — Other Ambulatory Visit: Payer: Self-pay

## 2020-07-07 DIAGNOSIS — I10 Essential (primary) hypertension: Secondary | ICD-10-CM | POA: Diagnosis not present

## 2020-07-07 LAB — BASIC METABOLIC PANEL
BUN: 16 mg/dL (ref 6–23)
CO2: 28 mEq/L (ref 19–32)
Calcium: 9.6 mg/dL (ref 8.4–10.5)
Chloride: 104 mEq/L (ref 96–112)
Creatinine, Ser: 1.12 mg/dL (ref 0.40–1.20)
GFR: 52.1 mL/min — ABNORMAL LOW (ref >60.00)
Glucose, Bld: 131 mg/dL — ABNORMAL HIGH (ref 70–99)
Potassium: 3.8 mEq/L (ref 3.5–5.1)
Sodium: 140 mEq/L (ref 135–145)

## 2020-07-20 ENCOUNTER — Ambulatory Visit: Payer: 59 | Admitting: Family Medicine

## 2020-07-20 NOTE — Progress Notes (Signed)
Cardiology Office Note   Date:  07/21/2020   ID:  Elizabeth [name], DOB 21-Feb-1956, MRN [id number]  PCP:  Shelda Pal, DO    No chief complaint on file.  HTN  Wt Readings from Last 3 Encounters:  07/21/20 158 lb 12.8 oz (72 kg)  06/29/20 162 lb 4 oz (73.6 kg)  06/17/20 162 lb 6.4 oz (73.7 kg)       History of Present Illness: Elizabeth [name] is a 65 y.o. female  female  who had HTN. She describes herself as anxious and this makes the BP increase as well. She lived in [place] before coming to [place]. BP at home in [place] was 130/80. Once a month, she may have a spike as described. She reports headache with the high readings.   She had a kidney stone and had lithotripsy in [place]. She uses some cystone for her pain.  In the past,she has some elevated blood pressure readings in the evening. She also has had difficulty losing weight. She has occasional swelling in her legs.She was prescribed a diuretic in 2019. Amlodipine was changed to twice daily to help with evening readings.  Due to insurance, they had to go to [facility] in 2020. Due to COVID, they did not see the cardiologist. THey switched insurance.  BP gets higher in the winter.  In 2021, she has had spikes in her BP to 562Z systolic.   She was hospitalized: "Admitted with hyponatremia in setting of nausea and reflux symptoms.  She's improved with IVF and BID protonix.  Esophagram showed moderate hiatal hernia with moderate gastroesophageal reflux to midesophagus.  Plan for outpatient GI follow up.   See below for additional details   Hospital Course:  Hypovolemic Hyponatremia Presented with Na 117 in setting of 6-7 days N/V and inability to take PO In addition on thiazide and spironolactone (on lasix prn at home) Corrected with IVF and d/c diuretics - 134 on day of d/c, continue to hold thiazide, spironolactone, lasix on discharge"  THey thought the hyponatremia was the cause of  her nausea.   BP has been high of late.  High readings at night, up to 308 systolic.   Past Medical History:  Diagnosis Date  . Diabetes mellitus type 2 in obese ([place])   . Hypertension     Past Surgical History:  Procedure Laterality Date  . CESAREAN SECTION       Current Outpatient Medications  Medication Sig Dispense Refill  . amLODipine (NORVASC) 5 MG tablet TAKE 1 TABLET(5 MG) BY MOUTH TWICE DAILY OR AS DIRECTED 60 tablet 3  . atorvastatin (LIPITOR) 40 MG tablet TAKE 1 TABLET(40 MG) BY MOUTH DAILY 90 tablet 3  . blood glucose meter kit and supplies KIT daily as needed. Dispense based on patient and insurance preference. Use up to once  daily as directed.    Marland Kitchen glucose blood (IGLUCOSE TEST STRIPS) test strip Dispense based on patient and insurance preference. Test once daily to check blood sugar. 100 each 12  . guanFACINE (TENEX) 1 MG tablet TAKE 2 TABLETS(2 MG) BY MOUTH EVERY NIGHT 60 tablet 1  . Lancets MISC Dispense based on patient and insurance preference.  Test once daily to check blood sugar.    . metoprolol tartrate (LOPRESSOR) 100 MG tablet Take 1 tablet (100 mg total) by mouth 2 (two) times daily. 180 tablet 3  . olmesartan (BENICAR) 20 MG tablet Take 1 tablet (20 mg total) by mouth daily. 30 tablet 2  .  polyethylene glycol (MIRALAX / GLYCOLAX) 17 g packet Take 17 g by mouth daily. 14 each 0   No current facility-administered medications for this visit.    Allergies:   Patient has no known allergies.    Social History:  The patient  reports that she has never smoked. She has never used smokeless tobacco. She reports that she does not drink alcohol and does not use drugs.   Family History:  The patient's family history includes Hypertension in her mother.    ROS:  Please see the history of present illness.   Otherwise, review of systems are positive for recent hospitalization in 2022.   All other systems are reviewed and negative.    PHYSICAL EXAM: VS:  BP 134/68    Pulse (!) 54   Ht 5' (1.524 m)   Wt 158 lb 12.8 oz (72 kg)   SpO2 99%   BMI 31.01 kg/m  , BMI Body mass index is 31.01 kg/m. GEN: Well nourished, well developed, in no acute distress  HEENT: normal  Neck: no JVD, carotid bruits, or masses Cardiac: *RRR; no murmurs, rubs, or gallops,no edema  Respiratory:  clear to auscultation bilaterally, normal work of breathing GI: soft, nontender, nondistended, + BS MS: no deformity or atrophy  Skin: warm and dry, no rash Neuro:  Strength and sensation are intact Psych: euthymic mood, full affect   EKG:   The ekg ordered in April 2022 demonstrates normal ECG   Recent Labs: 06/17/2020: TSH 1.552 06/19/2020: ALT 17; Magnesium 1.8 06/29/2020: Hemoglobin 12.2; Platelets 197.0 07/07/2020: BUN 16; Creatinine, Ser 1.12; Potassium 3.8; Sodium 140   Lipid Panel    Component Value Date/Time   CHOL 161 05/04/2020 0811   CHOL 160 04/20/2019 0928   TRIG 57.0 05/04/2020 0811   HDL 64.20 05/04/2020 0811   HDL 77 04/20/2019 0928   CHOLHDL 3 05/04/2020 0811   VLDL 11.4 05/04/2020 0811   LDLCALC 85 05/04/2020 0811   LDLCALC 73 04/20/2019 0928     Other studies Reviewed: Additional studies/ records that were reviewed today with results demonstrating: Hospital records reviewed.   ASSESSMENT AND PLAN:  1. HTN: Can Increase olmesartan to 20 mg BID. Could change metoprolol to carvedilol if BP remains high.  She is leaving the country on Sunday for a few weeks.  We will check her electrolytes when she gets back.  She already had a bmet after starting the 20 mg of olmesartan and everything was stable.  I suspect her sodium and renal function will be fine on the higher dose of olmesartan.  Minimize salt intake.  This may be more difficult while she is traveling. 2. Hyperlipidemia: LDL 85 in 2022.  Continue atorvastatin.  Whole food, plant-based diet. 3. Leg edema: resolved.  Avoid diuretics due to prior hyponatremia   Current medicines are reviewed at  length with the patient today.  The patient concerns regarding her medicines were addressed.  The following changes have been made:  No change  Labs/ tests ordered today include:  No orders of the defined types were placed in this encounter.   Recommend 150 minutes/week of aerobic exercise Low fat, low carb, high fiber diet recommended  Disposition:   FU in 6 months   Signed, Larae Grooms, MD  07/21/2020 9:42 AM    [facility] [address], [place], [place]  [address] Phone: [phone]; Fax: [phone]

## 2020-07-21 ENCOUNTER — Other Ambulatory Visit: Payer: Self-pay

## 2020-07-21 ENCOUNTER — Telehealth: Payer: Self-pay | Admitting: Interventional Cardiology

## 2020-07-21 ENCOUNTER — Ambulatory Visit: Payer: 59 | Admitting: Interventional Cardiology

## 2020-07-21 ENCOUNTER — Encounter: Payer: Self-pay | Admitting: Interventional Cardiology

## 2020-07-21 VITALS — BP 134/68 | HR 54 | Ht 60.0 in | Wt 158.8 lb

## 2020-07-21 DIAGNOSIS — I10 Essential (primary) hypertension: Secondary | ICD-10-CM

## 2020-07-21 DIAGNOSIS — M25471 Effusion, right ankle: Secondary | ICD-10-CM

## 2020-07-21 DIAGNOSIS — R7303 Prediabetes: Secondary | ICD-10-CM

## 2020-07-21 DIAGNOSIS — E782 Mixed hyperlipidemia: Secondary | ICD-10-CM | POA: Diagnosis not present

## 2020-07-21 DIAGNOSIS — E669 Obesity, unspecified: Secondary | ICD-10-CM

## 2020-07-21 DIAGNOSIS — M25472 Effusion, left ankle: Secondary | ICD-10-CM

## 2020-07-21 MED ORDER — OLMESARTAN MEDOXOMIL 20 MG PO TABS: 20.0000 mg | ORAL_TABLET | Freq: Two times a day (BID) | ORAL | 3 refills | Status: DC

## 2020-07-21 NOTE — Telephone Encounter (Signed)
I checked with [facility] and prior authorization required.

## 2020-07-21 NOTE — Telephone Encounter (Signed)
Pt c/o medication issue:  1. Name of Medication:  olmesartan (BENICAR) 20 MG tablet  2. How are you currently taking this medication (dosage and times per day)?   3. Are you having a reaction (difficulty breathing--STAT)?    4. What is your medication issue?   Patient was prescribed this medication today. Her husband states when they went to the pharmacy to pick it up they were told unable to pick it up and they were told to contact her insurance. He assumes it may be regarding an authorization, but he is unsure. He has not contacted the patient's insurance yet, but he requested a call back from Dr. Hoyle Barr nurse to discuss further.

## 2020-07-21 NOTE — Telephone Encounter (Signed)
**Note De-Identified Ritta Hammes Obfuscation** I started a urgent Olmesartan PA through covermymeds. Key: [id number]

## 2020-07-21 NOTE — Patient Instructions (Signed)
Medication Instructions:  Your physician has recommended you make the following change in your medication: Increase olmesartan to 20 mg by mouth twice daily  *If you need a refill on your cardiac medications before your next appointment, please call your pharmacy*   Lab Work: Your physician recommends that you return for lab work on May 31,2022--BMP.  This is not fasting.    If you have labs (blood work) drawn today and your tests are completely normal, you will receive your results only by: Marland Kitchen MyChart Message (if you have MyChart) OR . A paper copy in the mail If you have any lab test that is abnormal or we need to change your treatment, we will call you to review the results.   Testing/Procedures: none   Follow-Up: At [facility], you and your health needs are our priority.  As part of our continuing mission to provide you with exceptional heart care, we have created designated Provider Care Teams.  These Care Teams include your primary Cardiologist (physician) and Advanced Practice Providers (APPs -  Physician Assistants and Nurse Practitioners) who all work together to provide you with the care you need, when you need it.  We recommend signing up for the patient portal called "MyChart".  Sign up information is provided on this After Visit Summary.  MyChart is used to connect with patients for Virtual Visits (Telemedicine).  Patients are able to view lab/test results, encounter notes, upcoming appointments, etc.  Non-urgent messages can be sent to your provider as well.   To learn more about what you can do with MyChart, go to [email or web address].    Your next appointment:   6 month(s)  The format for your next appointment:   In Person  Provider:   You may see Lance Muss, MD or one of the following Advanced Practice Providers on your designated Care Team:    Ronie Spies, PA-C  Jacolyn Reedy, PA-C    Other Instructions

## 2020-07-22 ENCOUNTER — Telehealth: Payer: Self-pay | Admitting: Interventional Cardiology

## 2020-07-22 MED ORDER — OLMESARTAN MEDOXOMIL 40 MG PO TABS: ORAL_TABLET | ORAL | 3 refills | Status: DC

## 2020-07-22 NOTE — Telephone Encounter (Signed)
Called husband and informed Rx sent in. He appreciates our help with this!!

## 2020-07-22 NOTE — Telephone Encounter (Signed)
Pt c/o medication issue:  1. Name of Medication: olmesartan (BENICAR) 20 MG tablet  2. How are you currently taking this medication (dosage and times per day)? Not currently taken  3. Are you having a reaction (difficulty breathing--STAT)? No  4. What is your medication issue? PT is calling with concerns about this medication.PT states the pharmacy told them that the doctor need to call insurance to get medication approved

## 2020-07-22 NOTE — Telephone Encounter (Signed)
olmesartan is usually dosed once a day. I dont see why she cant just take 40mg  daily. I will confirm with Dr. . It can be cut if need be.

## 2020-07-22 NOTE — Telephone Encounter (Signed)
**Note De-Identified Shatima Zalar Obfuscation** I called [employer] and s/w Aram Beecham who advised me that if Dr Eldridge Dace increased the pts Olmesartan dose to 40 mg daily instead of BID it is cover but if not they will need documents explaining why she requires 20 mg BID as this is a quantity exception and not covered by plan for BID dosing.  I am forwarding this message to Dr Eldridge Dace for advisement.

## 2020-07-22 NOTE — Telephone Encounter (Signed)
Spoke to husband. Informed that our office is working on English as a second language teacher for the Benicar change. Informed that it could take 1-5 days. Advised that pt could take 2 of the Benicar she has on hand at home until we can work this out, per Dr. Eldridge Dace.  Husband reports that pt only has enough daily medication to get her through until next Friday, and they are leaving to go out of town tomorrow for 2 weeks.  And since we sent in Rx for BID, the old Rx they cannot get refills on anymore. Husband informed that I have reached out to [place] (prior [place]) who is going to call insurance and discuss case with them this morning. Aware we will let them know soon outcome. Husband is agreeable.

## 2020-07-22 NOTE — Telephone Encounter (Signed)
New Rx for olmesartan 40mg  1/2 tablet BID sent to [facility]

## 2020-07-25 NOTE — Telephone Encounter (Signed)
See 07/22/20 phone note

## 2020-07-29 ENCOUNTER — Ambulatory Visit: Payer: 59 | Admitting: Interventional Cardiology

## 2020-07-31 ENCOUNTER — Other Ambulatory Visit: Payer: Self-pay | Admitting: Family Medicine

## 2020-08-01 ENCOUNTER — Ambulatory Visit: Payer: 59 | Admitting: Interventional Cardiology

## 2020-08-04 ENCOUNTER — Other Ambulatory Visit: Payer: Self-pay | Admitting: Interventional Cardiology

## 2020-08-04 ENCOUNTER — Other Ambulatory Visit: Payer: Self-pay | Admitting: Family Medicine

## 2020-08-04 DIAGNOSIS — M7989 Other specified soft tissue disorders: Secondary | ICD-10-CM

## 2020-08-04 NOTE — Telephone Encounter (Signed)
Looks like hospital/ER removed off list.  Do you want to start back?

## 2020-08-09 ENCOUNTER — Other Ambulatory Visit: Payer: Self-pay

## 2020-08-09 ENCOUNTER — Other Ambulatory Visit: Payer: 59 | Admitting: *Deleted

## 2020-08-09 DIAGNOSIS — M25472 Effusion, left ankle: Secondary | ICD-10-CM

## 2020-08-09 DIAGNOSIS — E782 Mixed hyperlipidemia: Secondary | ICD-10-CM

## 2020-08-09 DIAGNOSIS — E669 Obesity, unspecified: Secondary | ICD-10-CM

## 2020-08-09 DIAGNOSIS — I10 Essential (primary) hypertension: Secondary | ICD-10-CM

## 2020-08-09 DIAGNOSIS — M25471 Effusion, right ankle: Secondary | ICD-10-CM

## 2020-08-09 DIAGNOSIS — R7303 Prediabetes: Secondary | ICD-10-CM

## 2020-08-09 DIAGNOSIS — E66811 Obesity, class 1: Secondary | ICD-10-CM

## 2020-08-09 LAB — BASIC METABOLIC PANEL
BUN/Creatinine Ratio: 15 (ref 12–28)
BUN: 15 mg/dL (ref 8–27)
CO2: 25 mmol/L (ref 20–29)
Calcium: 9.3 mg/dL (ref 8.7–10.3)
Chloride: 104 mmol/L (ref 96–106)
Creatinine, Ser: 0.99 mg/dL (ref 0.57–1.00)
Glucose: 123 mg/dL — ABNORMAL HIGH (ref 65–99)
Potassium: 4.7 mmol/L (ref 3.5–5.2)
Sodium: 141 mmol/L (ref 134–144)
eGFR: 64 mL/min/{1.73_m2} (ref >59)

## 2020-08-09 NOTE — Telephone Encounter (Signed)
**Note De-Identified Elizabeth [name] Obfuscation** Per Benita at [facility] the pt picked her Olmesartan up on 5/13 and paid $37.50/90 day supply.

## 2020-09-03 ENCOUNTER — Other Ambulatory Visit: Payer: Self-pay | Admitting: Family Medicine

## 2020-09-18 ENCOUNTER — Other Ambulatory Visit: Payer: Self-pay | Admitting: Family Medicine

## 2020-09-18 DIAGNOSIS — I10 Essential (primary) hypertension: Secondary | ICD-10-CM

## 2020-09-20 ENCOUNTER — Telehealth: Payer: Self-pay | Admitting: Family Medicine

## 2020-09-20 MED ORDER — METOPROLOL TARTRATE 50 MG PO TABS: ORAL_TABLET | ORAL | 3 refills | Status: DC

## 2020-09-20 MED ORDER — GUANFACINE HCL 1 MG PO TABS: ORAL_TABLET | ORAL | 3 refills | Status: DC

## 2020-09-20 MED ORDER — RABEPRAZOLE SODIUM 20 MG PO TBEC: DELAYED_RELEASE_TABLET | ORAL | 2 refills | Status: DC

## 2020-09-20 MED ORDER — AMLODIPINE BESYLATE 5 MG PO TABS: ORAL_TABLET | ORAL | 3 refills | Status: DC

## 2020-09-20 NOTE — Telephone Encounter (Signed)
Called the husband to clarify her medication refills and will send in what was requested. He did want to know if she was to be on rabeprazole? I did inform her husband this medication was not on her current list.

## 2020-09-20 NOTE — Telephone Encounter (Signed)
Called the husband back and she has been having reflux. Sent in the medication for her.

## 2020-09-20 NOTE — Telephone Encounter (Signed)
If she is having reflux symptoms off of it, we can resend it. Ty.

## 2020-09-20 NOTE — Telephone Encounter (Signed)
Patient's husband is calling in regards to blood pressure medication. He is confused and would like a call back to go ober meds

## 2021-01-07 ENCOUNTER — Other Ambulatory Visit: Payer: Self-pay | Admitting: Family Medicine

## 2021-01-30 ENCOUNTER — Ambulatory Visit (INDEPENDENT_AMBULATORY_CARE_PROVIDER_SITE_OTHER): Payer: 59 | Admitting: Family Medicine

## 2021-01-30 ENCOUNTER — Other Ambulatory Visit: Payer: Self-pay

## 2021-01-30 ENCOUNTER — Encounter: Payer: Self-pay | Admitting: Family Medicine

## 2021-01-30 VITALS — BP 124/84 | HR 54 | Temp 97.8°F | Ht 60.0 in | Wt 168.2 lb

## 2021-01-30 DIAGNOSIS — E1169 Type 2 diabetes mellitus with other specified complication: Secondary | ICD-10-CM

## 2021-01-30 DIAGNOSIS — I10 Essential (primary) hypertension: Secondary | ICD-10-CM | POA: Diagnosis not present

## 2021-01-30 DIAGNOSIS — L659 Nonscarring hair loss, unspecified: Secondary | ICD-10-CM

## 2021-01-30 DIAGNOSIS — E669 Obesity, unspecified: Secondary | ICD-10-CM | POA: Diagnosis not present

## 2021-01-30 LAB — COMPREHENSIVE METABOLIC PANEL
ALT: 10 U/L (ref 0–35)
AST: 10 U/L (ref 0–37)
Albumin: 4.1 g/dL (ref 3.5–5.2)
Alkaline Phosphatase: 66 U/L (ref 39–117)
BUN: 17 mg/dL (ref 6–23)
CO2: 29 mEq/L (ref 19–32)
Calcium: 9.3 mg/dL (ref 8.4–10.5)
Chloride: 104 mEq/L (ref 96–112)
Creatinine, Ser: 0.88 mg/dL (ref 0.40–1.20)
GFR: 69.3 mL/min (ref >60.00)
Glucose, Bld: 127 mg/dL — ABNORMAL HIGH (ref 70–99)
Potassium: 4.1 mEq/L (ref 3.5–5.1)
Sodium: 138 mEq/L (ref 135–145)
Total Bilirubin: 0.6 mg/dL (ref 0.2–1.2)
Total Protein: 6.9 g/dL (ref 6.0–8.3)

## 2021-01-30 LAB — CBC
HCT: 38.8 % (ref 36.0–46.0)
Hemoglobin: 12.6 g/dL (ref 12.0–15.0)
MCHC: 32.6 g/dL (ref 30.0–36.0)
MCV: 87.3 fl (ref 78.0–100.0)
Platelets: 154 10*3/uL (ref 150.0–400.0)
RBC: 4.44 Mil/uL (ref 3.87–5.11)
RDW: 13.1 % (ref 11.5–15.5)
WBC: 5 10*3/uL (ref 4.0–10.5)

## 2021-01-30 LAB — LIPID PANEL
Cholesterol: 171 mg/dL (ref 0–200)
HDL: 61.4 mg/dL (ref >39.00)
LDL Cholesterol: 93 mg/dL (ref 0–99)
NonHDL: 109.69
Total CHOL/HDL Ratio: 3
Triglycerides: 85 mg/dL (ref 0.0–149.0)
VLDL: 17 mg/dL (ref 0.0–40.0)

## 2021-01-30 LAB — IBC + FERRITIN
Ferritin: 40.6 ng/mL (ref 10.0–291.0)
Iron: 105 ug/dL (ref 42–145)
Saturation Ratios: 29.4 % (ref 20.0–50.0)
TIBC: 357 ug/dL (ref 250.0–450.0)
Transferrin: 255 mg/dL (ref 212.0–360.0)

## 2021-01-30 LAB — MICROALBUMIN / CREATININE URINE RATIO
Creatinine,U: 48.5 mg/dL
Microalb Creat Ratio: 1.4 mg/g (ref 0.0–30.0)
Microalb, Ur: <0.7 mg/dL (ref 0.0–1.9)

## 2021-01-30 LAB — TSH: TSH: 1.22 u[IU]/mL (ref 0.35–5.50)

## 2021-01-30 LAB — LUTEINIZING HORMONE: LH: 37.1 m[IU]/mL

## 2021-01-30 LAB — T4, FREE: Free T4: 1.07 ng/dL (ref 0.60–1.60)

## 2021-01-30 LAB — FOLLICLE STIMULATING HORMONE: FSH: 87.3 m[IU]/mL

## 2021-01-30 LAB — TESTOSTERONE: Testosterone: 32.66 ng/dL (ref 15.00–40.00)

## 2021-01-30 LAB — VITAMIN B12: Vitamin B-12: 164 pg/mL — ABNORMAL LOW (ref 211–911)

## 2021-01-30 LAB — T3, FREE: T3, Free: 3.5 pg/mL (ref 2.3–4.2)

## 2021-01-30 LAB — HEMOGLOBIN A1C: Hgb A1c MFr Bld: 6.4 % (ref 4.6–6.5)

## 2021-01-30 MED ORDER — RABEPRAZOLE SODIUM 20 MG PO TBEC: DELAYED_RELEASE_TABLET | ORAL | 2 refills | Status: DC

## 2021-01-30 NOTE — Progress Notes (Signed)
Chief Complaint  Patient presents with   Follow-up    Subjective Elizabeth [name] is a 65 y.o. female who presents for hypertension follow up. Here w spouse.  She does not monitor home blood pressures. She is compliant with medications- chlorthalidone 25 mg/d, olmesartan 20 mg/d, metoprolol 100 mg bid. Patient has these side effects of medication: none She is adhering to a healthy diet overall. Current exercise: walking No Cp or SOB.   DMII Diet controlled. A1c up to 6.9, most recently 6.3. On Lipitor 40 mg/d. Doesn't ck sugars. No insulin.   Hair loss Hair loss over past year. Going to transplant clinic in [place] and requesting specific labs be drawn.    Past Medical History:  Diagnosis Date   Diabetes mellitus type 2 in obese (H[facility])    Hypertension     Exam BP 124/84   Pulse (!) 54   Temp 97.8 F (36.6 C) (Oral)   Ht 5' (1.524 m)   Wt 168 lb 4 oz (76.3 kg)   SpO2 97%   BMI 32.86 kg/m  General:  well developed, well nourished, in no apparent distress Heart: Reg rhythm, bradycardic, no bruits, no LE edema Lungs: clear to auscultation, no accessory muscle use Skin: thinning hair on crown throughout; no skin lesions on feet Neuro: sensation intact to pinprick b/l Psych: well oriented with normal range of affect and appropriate judgment/insight  Diabetes mellitus type 2 in obese (HCC) - Plan: Comprehensive metabolic panel, Lipid panel, Microalbumin / creatinine urine ratio  Essential hypertension, benign  Hair thinning - Plan: CBC, IBC + Ferritin, Hemoglobin A1c, TSH, T4, free, T3, free, LH, FSH, Estradiol, Prolactin, Testosterone, B12  Chronic, stable. Diet controlled. Ck labs. Counseled on diet and exercise. Chronic, stable. Cont meds as listed above.  Ck labs as requested by transplant team in [place].  F/u in 6 mo. The patient and her spouse voiced understanding and agreement to the plan.  Jilda Roche [place], DO 01/30/21  10:54 AM

## 2021-01-30 NOTE — Patient Instructions (Signed)
Give us 2-3 business days to get the results of your labs back.   Keep the diet clean and stay active.  Let us know if you need anything. 

## 2021-01-31 ENCOUNTER — Other Ambulatory Visit: Payer: 59

## 2021-01-31 ENCOUNTER — Other Ambulatory Visit: Payer: Self-pay | Admitting: Family Medicine

## 2021-01-31 DIAGNOSIS — E538 Deficiency of other specified B group vitamins: Secondary | ICD-10-CM

## 2021-01-31 DIAGNOSIS — M7989 Other specified soft tissue disorders: Secondary | ICD-10-CM

## 2021-01-31 LAB — ESTRADIOL: Estradiol: <15 pg/mL

## 2021-01-31 LAB — PROLACTIN: Prolactin: 7.3 ng/mL

## 2021-01-31 MED ORDER — GUANFACINE HCL 1 MG PO TABS: ORAL_TABLET | ORAL | 3 refills | Status: DC

## 2021-02-03 LAB — INTRINSIC FACTOR ANTIBODIES: Intrinsic Factor: NEGATIVE

## 2021-02-25 ENCOUNTER — Other Ambulatory Visit: Payer: Self-pay | Admitting: Family Medicine

## 2021-05-22 ENCOUNTER — Other Ambulatory Visit: Payer: Self-pay | Admitting: Family Medicine

## 2021-05-26 ENCOUNTER — Other Ambulatory Visit: Payer: Self-pay | Admitting: Family Medicine

## 2021-06-05 ENCOUNTER — Other Ambulatory Visit: Payer: Self-pay

## 2021-06-05 ENCOUNTER — Telehealth: Payer: Self-pay | Admitting: Family Medicine

## 2021-06-05 IMAGING — MG DIGITAL SCREENING BILAT W/ TOMO W/ CAD
8 series · 8 of 24 positions shown · non-contrast
Comparison: None.

CLINICAL DATA: Screening.

EXAM:
DIGITAL SCREENING BILATERAL MAMMOGRAM WITH TOMO AND CAD

[R CC synth-2D]
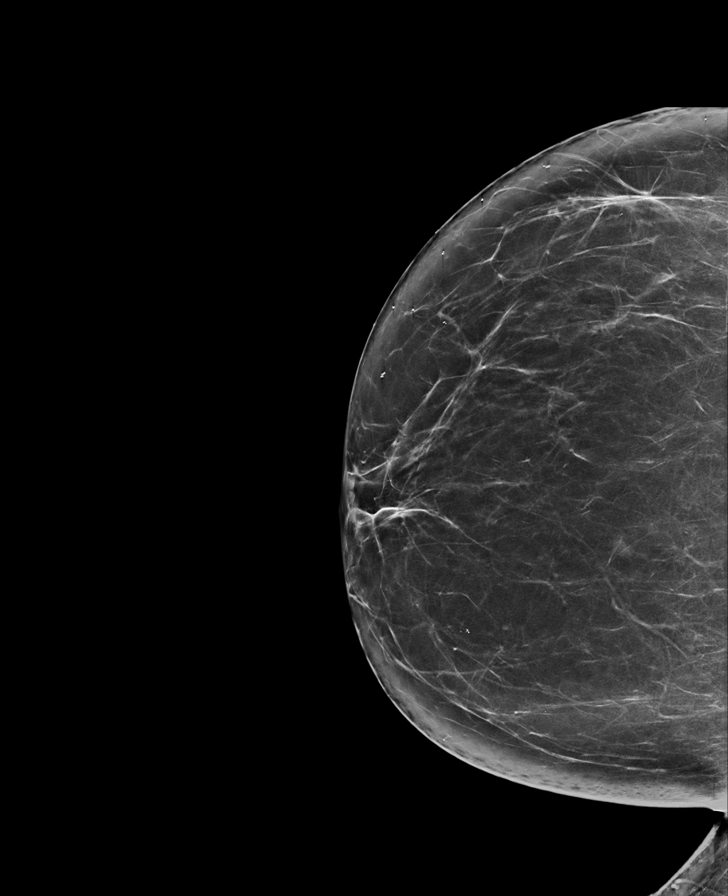

[R MLO synth-2D]
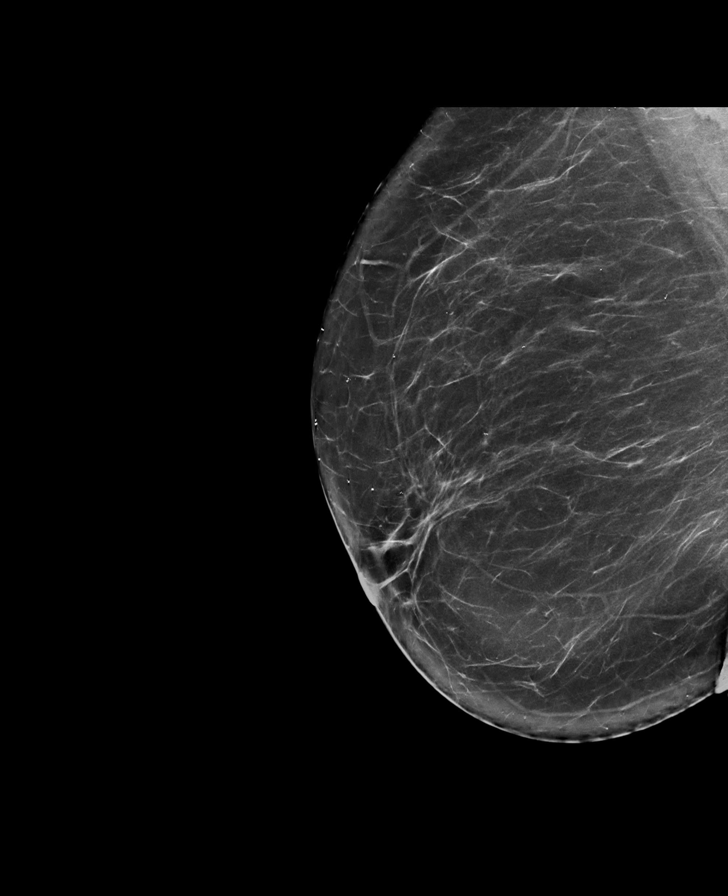

[L CC synth-2D]
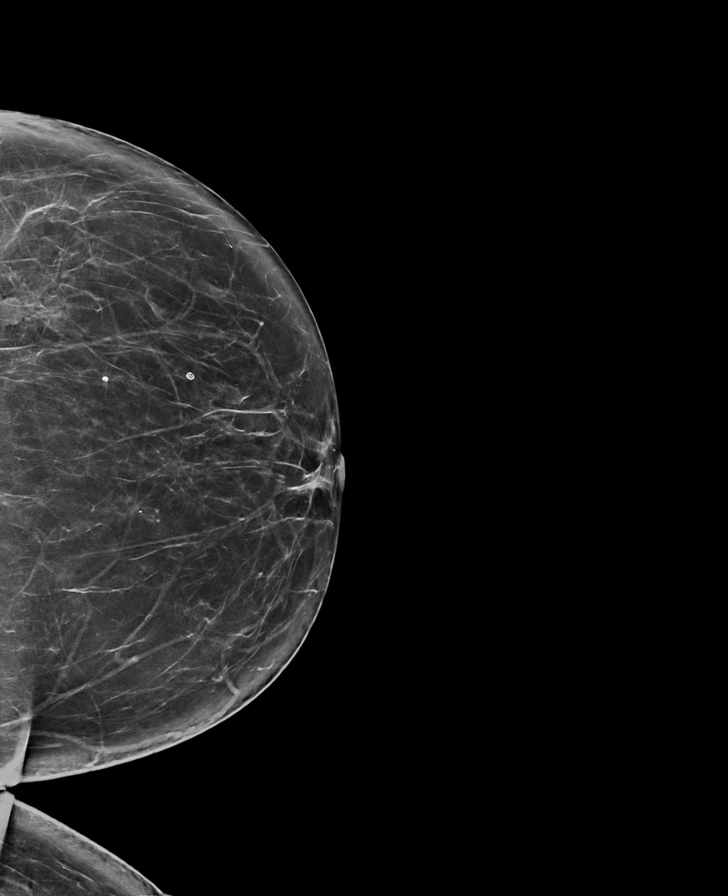

[L MLO synth-2D]
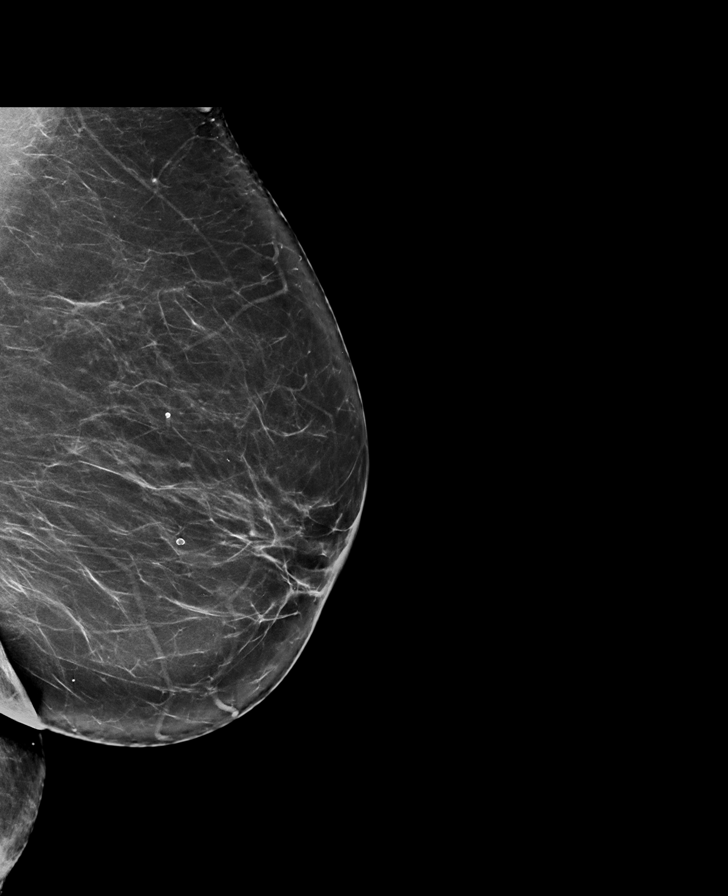

[R CC tomo · tomo slice 39/77.0]
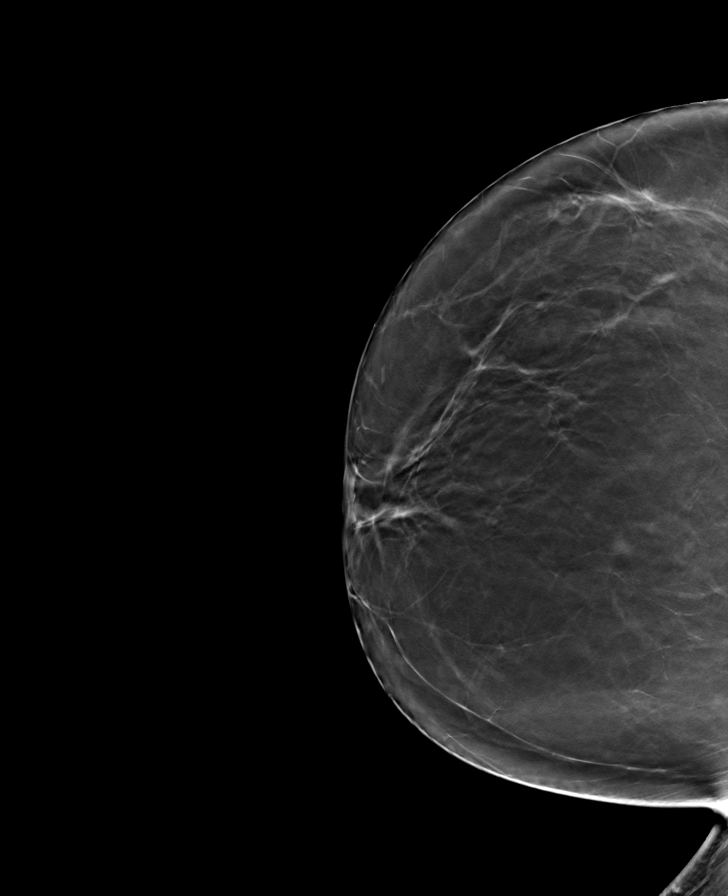

[R MLO tomo · tomo slice 43/86.0]
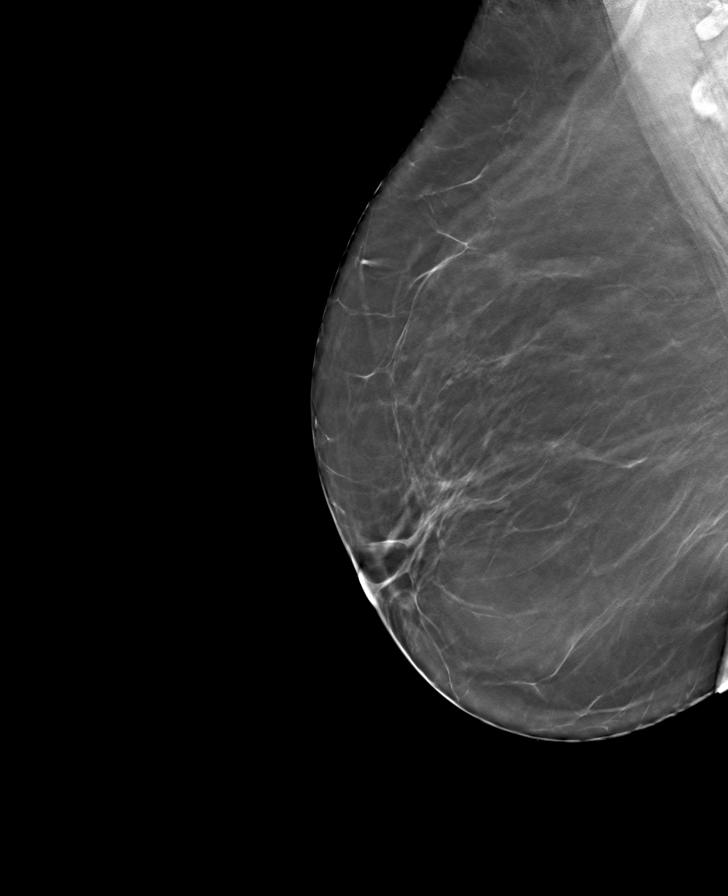

[L CC tomo · tomo slice 37/74.0]
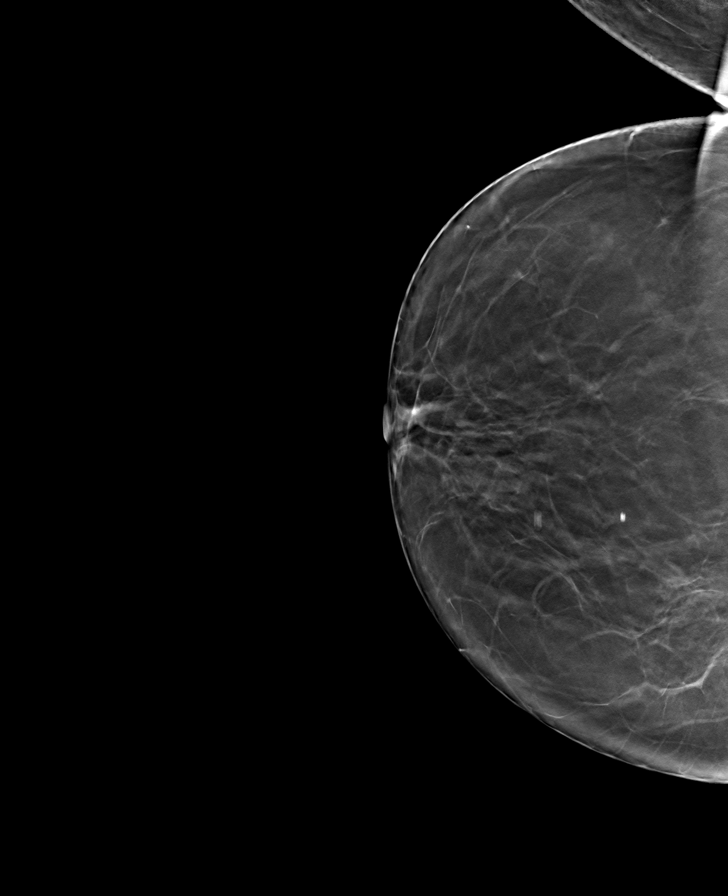

[L MLO tomo · tomo slice 41/81.0]
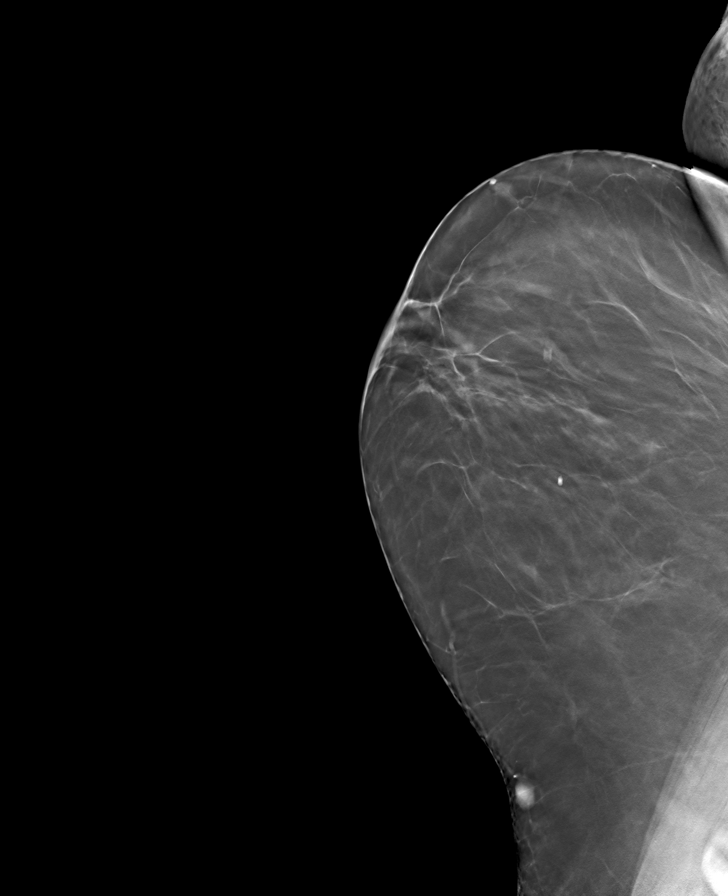

[8 of 24 positions shown; findings below may reference images not displayed]

ACR Breast Density Category b: There are scattered areas of
fibroglandular density.
FINDINGS: There are no findings suspicious for malignancy. Images were
processed with CAD.
IMPRESSION: No mammographic evidence of malignancy. A result letter of this
screening mammogram will be mailed directly to the patient.

RECOMMENDATION:
Screening mammogram in one year. (Code:[id number]-G-EJ6)

BI-RADS CATEGORY  1: Negative.

## 2021-06-05 MED ORDER — AMLODIPINE BESYLATE 5 MG PO TABS: ORAL_TABLET | ORAL | 3 refills | Status: DC

## 2021-06-05 NOTE — Telephone Encounter (Signed)
Sent in

## 2021-06-05 NOTE — Telephone Encounter (Signed)
Medication: amLODipine (NORVASC) 5 MG tablet  ? ?Has the patient contacted their pharmacy? Yes.   ? ?Preferred Pharmacy (with phone number or street name):  ?[facility] [phone] - [place], [place] - [address]  ?[address], [place] [address]  ?Phone:  [phone]  Fax:  [phone]  ? ?Agent: Please be advised that RX refills may take up to 3 business days. We ask that you follow-up with your pharmacy.  ?

## 2021-06-19 ENCOUNTER — Telehealth: Payer: Self-pay | Admitting: Family Medicine

## 2021-06-19 MED ORDER — OLMESARTAN MEDOXOMIL 40 MG PO TABS: ORAL_TABLET | ORAL | 0 refills | Status: DC

## 2021-06-19 NOTE — Telephone Encounter (Signed)
Sent!

## 2021-06-19 NOTE — Telephone Encounter (Signed)
Medication: olmesartan (BENICAR) 40 MG tablet ? ?Has the patient contacted their pharmacy? Yes.   ? ? ?Preferred Pharmacy: [facility] [phone] - [place], [place] - [address]  ?[address], [place] [address]  ?Phone:  [phone]  Fax:  [phone]  ? ? ?

## 2021-07-23 ENCOUNTER — Other Ambulatory Visit: Payer: Self-pay | Admitting: Family Medicine

## 2021-07-23 DIAGNOSIS — M7989 Other specified soft tissue disorders: Secondary | ICD-10-CM

## 2021-08-22 ENCOUNTER — Other Ambulatory Visit: Payer: Self-pay | Admitting: Family Medicine

## 2021-08-23 ENCOUNTER — Other Ambulatory Visit: Payer: Self-pay

## 2021-08-23 MED ORDER — OLMESARTAN MEDOXOMIL 40 MG PO TABS: ORAL_TABLET | ORAL | 0 refills | Status: DC

## 2021-08-24 ENCOUNTER — Telehealth: Payer: Self-pay | Admitting: Family Medicine

## 2021-08-24 NOTE — Telephone Encounter (Signed)
Medication: atorvastatin (LIPITOR) 40 MG tablet  Has the patient contacted their pharmacy? Yes.    Preferred Pharmacy (with phone number or street name): [facility] [phone] - [place], [place] - [address] AT [facility] [address]  [address] [place] [address]  Phone:  [phone]  Fax:  [phone]

## 2021-08-25 ENCOUNTER — Ambulatory Visit (INDEPENDENT_AMBULATORY_CARE_PROVIDER_SITE_OTHER): Payer: 59 | Admitting: Family Medicine

## 2021-08-25 ENCOUNTER — Encounter: Payer: Self-pay | Admitting: Family Medicine

## 2021-08-25 ENCOUNTER — Other Ambulatory Visit: Payer: Self-pay

## 2021-08-25 VITALS — BP 122/72 | HR 50 | Temp 98.0°F | Ht 60.0 in | Wt 164.4 lb

## 2021-08-25 DIAGNOSIS — I10 Essential (primary) hypertension: Secondary | ICD-10-CM

## 2021-08-25 DIAGNOSIS — J309 Allergic rhinitis, unspecified: Secondary | ICD-10-CM | POA: Diagnosis not present

## 2021-08-25 MED ORDER — PREDNISONE 20 MG PO TABS: 40.0000 mg | ORAL_TABLET | Freq: Every day | ORAL | 0 refills | Status: AC

## 2021-08-25 MED ORDER — METOPROLOL TARTRATE 100 MG PO TABS
100.0000 mg | ORAL_TABLET | Freq: Two times a day (BID) | ORAL | 3 refills | Status: DC
  Filled 2022-04-16: qty 180, 90d supply, fill #0

## 2021-08-25 MED ORDER — ATORVASTATIN CALCIUM 40 MG PO TABS: ORAL_TABLET | ORAL | 3 refills | Status: DC

## 2021-08-25 MED ORDER — OLMESARTAN MEDOXOMIL 40 MG PO TABS: 20.0000 mg | ORAL_TABLET | Freq: Two times a day (BID) | ORAL | 1 refills | Status: DC

## 2021-08-25 MED ORDER — FLUTICASONE PROPIONATE 50 MCG/ACT NA SUSP: 2.0000 | Freq: Every day | NASAL | 6 refills | Status: DC

## 2021-08-25 NOTE — Telephone Encounter (Signed)
Refills done.

## 2021-08-25 NOTE — Patient Instructions (Signed)
Claritin (loratadine), Allegra (fexofenadine), Zyrtec (cetirizine) which is also equivalent to Xyzal (levocetirizine); these are listed in order from weakest to strongest. Generic, and therefore cheaper, options are in the parentheses.  ? ?Flonase (fluticasone); nasal spray that is over the counter. 2 sprays each nostril, once daily. Aim towards the same side eye when you spray. ? ?There are available OTC, and the generic versions, which may be cheaper, are in parentheses. Show this to a pharmacist if you have trouble finding any of these items. ? ?Let us know if you need anything. ?

## 2021-08-25 NOTE — Progress Notes (Signed)
Chief Complaint  Patient presents with   Cough   Allergies    Sore throat     Elizabeth [name] here for URI complaints.  Here with spouse.  Duration: 3 weeks  Associated symptoms: itchy watery eyes, sore throat, and coughing Denies: sinus congestion, sinus pain, rhinorrhea, ear pain, ear drainage, wheezing, shortness of breath, myalgia, and fevers Treatment to date: amoxicillin for 5 d (left over), Mucinex Sick contacts: No  Past Medical History:  Diagnosis Date   Diabetes mellitus type 2 in obese (HCC)    Hypertension     Objective BP 122/72   Pulse (!) 50   Temp 98 F (36.7 C) (Oral)   Ht 5' (1.524 m)   Wt 164 lb 6 oz (74.6 kg)   SpO2 99%   BMI 32.10 kg/m  General: Awake, alert, appears stated age HEENT: AT, [place], ears patent b/l and TM's neg, nares patent w/o discharge, pharynx pink and without exudates, MMM Neck: Submandibular gland TTP and slightly swollen on the left compared to the right Heart: RRR Lungs: CTAB, no accessory muscle use Psych: Age appropriate judgment and insight, normal mood and affect  Allergic rhinitis, unspecified seasonality, unspecified trigger - Plan: predniSONE (DELTASONE) 20 MG tablet, fluticasone (FLONASE) 50 MCG/ACT nasal spray  Primary hypertension - Plan: metoprolol tartrate (LOPRESSOR) 100 MG tablet  5-day prednisone burst 40 mg daily for lymphadenitis.  We will add steroid nasal spray for allergic rhinitis symptoms. F/u prn for this. If starting to experience fevers, shaking, or shortness of breath, seek immediate care. I will see her in 3 weeks for a diabetes visit. Pt and her spouse voiced understanding and agreement to the plan.  Jilda Roche [place], DO 08/25/21 9:22 AM

## 2021-08-29 ENCOUNTER — Other Ambulatory Visit: Payer: Self-pay | Admitting: Family Medicine

## 2021-09-15 ENCOUNTER — Ambulatory Visit: Payer: 59 | Admitting: Family Medicine

## 2021-09-21 ENCOUNTER — Other Ambulatory Visit: Payer: Self-pay | Admitting: Family Medicine

## 2021-09-22 ENCOUNTER — Other Ambulatory Visit: Payer: Self-pay | Admitting: Family Medicine

## 2021-09-27 ENCOUNTER — Telehealth: Payer: Self-pay | Admitting: Family Medicine

## 2021-09-27 MED ORDER — OLMESARTAN MEDOXOMIL 40 MG PO TABS: 20.0000 mg | ORAL_TABLET | Freq: Two times a day (BID) | ORAL | 1 refills | Status: DC

## 2021-09-27 MED ORDER — RABEPRAZOLE SODIUM 20 MG PO TBEC
20.0000 mg | DELAYED_RELEASE_TABLET | Freq: Two times a day (BID) | ORAL | 2 refills | Status: DC
  Filled 2022-04-16: qty 60, 30d supply, fill #0
  Filled 2022-05-16: qty 60, 30d supply, fill #1

## 2021-09-27 NOTE — Telephone Encounter (Signed)
Pt's husband stated pharmacy told them they have no refills.   Medication: RABEprazole (ACIPHEX) 20 MG tablet  olmesartan (BENICAR) 40 MG tablet  Has the patient contacted their pharmacy? Yes.    Preferred Pharmacy:  [facility] [phone] - [place], [place] - [address] AT [facility] [address] [facility] [address]   [address] [place] [address]  Phone:  [phone]  Fax:  [phone]

## 2021-09-27 NOTE — Telephone Encounter (Signed)
Refills done and patient made aware

## 2021-10-16 ENCOUNTER — Ambulatory Visit (HOSPITAL_BASED_OUTPATIENT_CLINIC_OR_DEPARTMENT_OTHER)
Admission: RE | Admit: 2021-10-16 | Discharge: 2021-10-16 | Disposition: A | Discharge status: Home / Self Care | Payer: Commercial Managed Care - HMO | Source: Ambulatory Visit | Attending: Family Medicine | Admitting: Family Medicine

## 2021-10-16 ENCOUNTER — Ambulatory Visit (INDEPENDENT_AMBULATORY_CARE_PROVIDER_SITE_OTHER): Payer: Commercial Managed Care - HMO | Admitting: Family Medicine

## 2021-10-16 ENCOUNTER — Encounter: Payer: Self-pay | Admitting: Family Medicine

## 2021-10-16 ENCOUNTER — Encounter (HOSPITAL_BASED_OUTPATIENT_CLINIC_OR_DEPARTMENT_OTHER): Payer: Self-pay

## 2021-10-16 VITALS — BP 120/78 | HR 49 | Temp 97.8°F | Ht 60.0 in | Wt 167.1 lb

## 2021-10-16 DIAGNOSIS — M79671 Pain in right foot: Secondary | ICD-10-CM

## 2021-10-16 DIAGNOSIS — E2839 Other primary ovarian failure: Secondary | ICD-10-CM

## 2021-10-16 DIAGNOSIS — Z23 Encounter for immunization: Secondary | ICD-10-CM

## 2021-10-16 DIAGNOSIS — E1169 Type 2 diabetes mellitus with other specified complication: Secondary | ICD-10-CM | POA: Diagnosis not present

## 2021-10-16 DIAGNOSIS — I10 Essential (primary) hypertension: Secondary | ICD-10-CM | POA: Diagnosis not present

## 2021-10-16 DIAGNOSIS — Z1231 Encounter for screening mammogram for malignant neoplasm of breast: Secondary | ICD-10-CM | POA: Diagnosis not present

## 2021-10-16 DIAGNOSIS — E669 Obesity, unspecified: Secondary | ICD-10-CM

## 2021-10-16 LAB — COMPREHENSIVE METABOLIC PANEL
ALT: 20 U/L (ref 0–35)
AST: 19 U/L (ref 0–37)
Albumin: 3.8 g/dL (ref 3.5–5.2)
Alkaline Phosphatase: 77 U/L (ref 39–117)
BUN: 20 mg/dL (ref 6–23)
CO2: 29 mEq/L (ref 19–32)
Calcium: 9.1 mg/dL (ref 8.4–10.5)
Chloride: 104 mEq/L (ref 96–112)
Creatinine, Ser: 1.12 mg/dL (ref 0.40–1.20)
GFR: 51.63 mL/min — ABNORMAL LOW (ref >60.00)
Glucose, Bld: 128 mg/dL — ABNORMAL HIGH (ref 70–99)
Potassium: 4.5 mEq/L (ref 3.5–5.1)
Sodium: 140 mEq/L (ref 135–145)
Total Bilirubin: 0.3 mg/dL (ref 0.2–1.2)
Total Protein: 6.4 g/dL (ref 6.0–8.3)

## 2021-10-16 LAB — LIPID PANEL
Cholesterol: 194 mg/dL (ref 0–200)
HDL: 59.5 mg/dL (ref >39.00)
LDL Cholesterol: 119 mg/dL — ABNORMAL HIGH (ref 0–99)
NonHDL: 134.11
Total CHOL/HDL Ratio: 3
Triglycerides: 76 mg/dL (ref 0.0–149.0)
VLDL: 15.2 mg/dL (ref 0.0–40.0)

## 2021-10-16 LAB — HEMOGLOBIN A1C: Hgb A1c MFr Bld: 6.9 % — ABNORMAL HIGH (ref 4.6–6.5)

## 2021-10-16 LAB — URIC ACID: Uric Acid, Serum: 5.7 mg/dL (ref 2.4–7.0)

## 2021-10-16 MED ORDER — AMLODIPINE BESYLATE 5 MG PO TABS
5.0000 mg | ORAL_TABLET | Freq: Two times a day (BID) | ORAL | 3 refills | Status: DC
  Filled 2022-04-16: qty 60, 30d supply, fill #0
  Filled 2022-05-16: qty 60, 30d supply, fill #1
  Filled 2022-06-15: qty 60, 30d supply, fill #2
  Filled 2022-07-16 – 2022-07-23 (×2): qty 60, 30d supply, fill #3
  Filled 2022-08-17: qty 60, 30d supply, fill #4
  Filled 2022-09-11: qty 60, 30d supply, fill #5

## 2021-10-16 NOTE — Progress Notes (Signed)
Subjective:   Chief Complaint  Patient presents with   Follow-up    Elizabeth [name] is a 66 y.o. female here for follow-up of diabetes.  Here with her spouse who helps interpret. Elizabeth [name] does not routinely check her sugars.  Patient does not require insulin.   Medications include: diet controlled Diet is healthy.  Exercise: walking  HTN She does monitor home blood pressures. Blood pressures ranging from 120's/70's on average. She is compliant with medications- Norvasc 5 mg bid, Benicar 40 mg/d, chlorthalidone 25 mg/d, metoprolol 100 mg bid. Patient has these side effects of medication: none Diet/exercise as above.  No CP or SOB.  Past Medical History:  Diagnosis Date   Diabetes mellitus type 2 in obese (HCC)    Hypertension      Related testing: Retinal exam: Due Pneumovax: Due today  Objective:  BP 120/78   Pulse (!) 49   Temp 97.8 F (36.6 C) (Oral)   Ht 5' (1.524 m)   Wt 167 lb 2 oz (75.8 kg)   SpO2 99%   BMI 32.64 kg/m  General:  Well developed, well nourished, in no apparent distress Lungs:  CTAB, no access msc use Cardio: Regular rhythm, bradycardic, no bruits, 1+ pitting bilateral lower extremity edema tapering at the distal third of the tibia Psych: Age appropriate judgment and insight  Assessment:   Diabetes mellitus type 2 in obese (HCC) - Plan: Lipid panel, Hemoglobin A1c, Comprehensive metabolic panel, Ambulatory referral to Ophthalmology  Essential hypertension, benign  Encounter for screening mammogram for malignant neoplasm of breast - Plan: MM DIGITAL SCREENING BILATERAL  Estrogen deficiency - Plan: DG Bone Density  Right foot pain - Plan: Uric acid  Need for vaccination against Streptococcus pneumoniae - Plan: Pneumococcal conjugate vaccine 20-valent (Prevnar 20)   Plan:   Chronic, stable.  Continue Lipitor 40 mg daily.  Continue diet control for diabetes.  Does not need to check sugars at home at this time.  Counseled on diet and  exercise. Chronic, stable.  Continue Norvasc 5 mg twice daily, Lopressor 100 mg twice daily, chlorthalidone 25 mg daily, Benicar 40 mg daily. Shingrix recommended.  We will catch up on various health maintenance topics given she recently turned 66. F/u in 6 mo. The patient and her spouse voiced understanding and agreement to the plan.  Jilda Roche [place], DO 10/16/21 10:07 AM

## 2021-10-16 NOTE — Patient Instructions (Addendum)
Give [place] 2-3 business days to get the results of your labs back.   Foods to AVOID: Red meat, organ meat (liver), lunch meat, seafood (mussels, scallops, anchovies, etc) Alcohol Sugary foods/beverages (diet soft drinks have no link to flares)  Foods to migrate to: Dairy Vegetables Cherries have limited data to suggest they help lower uric acid levels (and prevent flares) Vit C (500 mg daily) may have a modest effect with preventing flares Poultry If you are going to eat red meat, beef and pork may give you less problems than lamb.  Consider Pepcid (famotidine) 20 mg twice daily as needed for when you are about to eat foods with spice.  The Shingrix vaccine (for shingles) is a 2 shot series spaced 2-6 months apart. It can make people feel low energy, achy and almost like they have the flu for 48 hours after injection. 1/5 people can have nausea and/or vomiting. Please plan accordingly when deciding on when to get this shot. Call our office for a nurse visit appointment to get this. The second shot of the series is less severe regarding the side effects, but it still lasts 48 hours.   Let us know if you need anything.

## 2021-10-19 ENCOUNTER — Ambulatory Visit (HOSPITAL_BASED_OUTPATIENT_CLINIC_OR_DEPARTMENT_OTHER)
Admission: RE | Admit: 2021-10-19 | Discharge: 2021-10-19 | Disposition: A | Discharge status: Home / Self Care | Payer: Commercial Managed Care - HMO | Source: Ambulatory Visit | Attending: Family Medicine | Admitting: Family Medicine

## 2021-10-19 DIAGNOSIS — E2839 Other primary ovarian failure: Secondary | ICD-10-CM | POA: Insufficient documentation

## 2021-10-19 LAB — HM DIABETES EYE EXAM

## 2021-10-20 ENCOUNTER — Encounter: Payer: Self-pay | Admitting: Family Medicine

## 2021-11-07 NOTE — Progress Notes (Unsigned)
Cardiology Office Note   Date:  11/08/2021   ID:  Mckell, Riecke Dec 14, 1955, MRN [id number]  PCP:  Shelda Pal, DO    No chief complaint on file.  HTN  Wt Readings from Last 3 Encounters:  11/08/21 169 lb (76.7 kg)  10/16/21 167 lb 2 oz (75.8 kg)  08/25/21 164 lb 6 oz (74.6 kg)       History of Present Illness: Elizabeth [name] is a 66 y.o. female  who had HTN.  She describes herself as anxious and this makes the BP increase as well.  She lived in [place] before coming to [place].  BP at home in [place] was 130/80.  Once a month, she may have a spike as described.  She reports headache with the high readings.     She had a kidney stone and had lithotripsy in [place].  She uses some cystone for her pain.    In the past, she has some elevated blood pressure readings in the evening.  She also has had difficulty losing weight.  She has occasional swelling in her legs.   She was prescribed a diuretic in 2019. Amlodipine was changed to twice daily to help with evening readings.   Due to insurance, they had to go to [facility] in 2020. Due to COVID, they did not see the cardiologist. THey switched insurance.   BP gets higher in the winter.   In 2021, she has had spikes in her BP to 117B systolic.    She was hospitalized: "Admitted with hyponatremia in setting of nausea and reflux symptoms.     She's improved with IVF and BID protonix.  Esophagram showed moderate hiatal hernia with moderate gastroesophageal reflux to midesophagus.  Plan for outpatient GI follow up.    See below for additional details    Hospital Course:  Hypovolemic Hyponatremia Presented with Na 117 in setting of 6-7 days N/V and inability to take PO In addition on thiazide and spironolactone (on lasix prn at home) Corrected with IVF and d/c diuretics - 134 on day of d/c, continue to hold thiazide, spironolactone, lasix on discharge"   THey thought the hyponatremia was the cause of her nausea.    Olmesartan has helped with BP.   Denies : Chest pain. Dizziness. Leg edema. Nitroglycerin use. Orthopnea. Palpitations. Paroxysmal nocturnal dyspnea. Shortness of breath. Syncope.    Walking regularly- mostly mornings to avoid the asheat.    Past Medical History:  Diagnosis Date   Diabetes mellitus type 2 in obese [facility])    Hypertension     Past Surgical History:  Procedure Laterality Date   CESAREAN SECTION       Current Outpatient Medications  Medication Sig Dispense Refill   amLODipine (NORVASC) 5 MG tablet Take 1 tab twice daily. 180 tablet 3   atorvastatin (LIPITOR) 40 MG tablet TAKE 1 TABLET(40 MG) BY MOUTH DAILY 90 tablet 3   blood glucose meter kit and supplies KIT daily as needed. Dispense based on patient and insurance preference. Use up to once  daily as directed.     fluticasone (FLONASE) 50 MCG/ACT nasal spray Place 2 sprays into both nostrils daily. 16 g 6   furosemide (LASIX) 40 MG tablet TAKE 1 TABLET(40 MG) BY MOUTH DAILY AS NEEDED FOR FLUID RETENTION OR SWELLING 90 tablet 1   glucose blood (IGLUCOSE TEST STRIPS) test strip Dispense based on patient and insurance preference. Test once daily to check blood sugar. 100 each 12  guanFACINE (TENEX) 1 MG tablet TAKE 2 TABLETS BY MOUTH EVERY NIGHT 60 tablet 3   Lancets MISC Dispense based on patient and insurance preference.  Test once daily to check blood sugar.     metoprolol tartrate (LOPRESSOR) 100 MG tablet Take 1 tablet (100 mg total) by mouth 2 (two) times daily. 180 tablet 3   metoprolol tartrate (LOPRESSOR) 50 MG tablet TAKE 1 TABLET BY MOUTH EVERY DAY AS NEEDED IF BLOOD PRESSURE IS 150-180 30 tablet 3   olmesartan (BENICAR) 40 MG tablet Take 0.5 tablets (20 mg total) by mouth in the morning and at bedtime. 90 tablet 1   RABEprazole (ACIPHEX) 20 MG tablet TAKE 1 TABLET(20 MG) BY MOUTH TWICE DAILY 180 tablet 2   No current facility-administered medications for this visit.    Allergies:   Patient has no known  allergies.    Social History:  The patient  reports that she has never smoked. She has never used smokeless tobacco. She reports that she does not drink alcohol and does not use drugs.   Family History:  The patient's family history includes Hypertension in her mother.    ROS:  Please see the history of present illness.   Otherwise, review of systems are positive for difficulty losing weight .   All other systems are reviewed and negative.    PHYSICAL EXAM: VS:  BP (!) 150/80 (BP Location: Left Arm, Patient Position: Sitting, Cuff Size: Normal)   Ht 5' (1.524 m)   Wt 169 lb (76.7 kg)   BMI 33.01 kg/m  , BMI Body mass index is 33.01 kg/m. GEN: Well nourished, well developed, in no acute distress HEENT: normal Neck: no JVD, carotid bruits, or masses Cardiac: RRR; no murmurs, rubs, or gallops,no edema  Respiratory:  clear to auscultation bilaterally, normal work of breathing GI: soft, nontender, nondistended, + BS MS: no deformity or atrophy Skin: warm and dry, no rash Neuro:  Strength and sensation are intact Psych: euthymic mood, full affect   EKG:   The ekg ordered today demonstrates normal ECG   Recent Labs: 01/30/2021: Hemoglobin 12.6; Platelets 154.0; TSH 1.22 10/16/2021: ALT 20; BUN 20; Creatinine, Ser 1.12; Potassium 4.5; Sodium 140   Lipid Panel    Component Value Date/Time   CHOL 194 10/16/2021 0919   CHOL 160 04/20/2019 0928   TRIG 76.0 10/16/2021 0919   HDL 59.50 10/16/2021 0919   HDL 77 04/20/2019 0928   CHOLHDL 3 10/16/2021 0919   VLDL 15.2 10/16/2021 0919   LDLCALC 119 (H) 10/16/2021 0919   LDLCALC 73 04/20/2019 0928     Other studies Reviewed: Additional studies/ records that were reviewed today with results demonstrating: LDL 119.   ASSESSMENT AND PLAN:  HTN: Mildly elevated today.  Low-salt diet recommended.  Home readings are well controlled.  She often has high readings in the doctor's office.  Would not make any changes.  Readings have  improved with olmesartan. Hyperlipidemia: Whole food, plant-based diet recommended.  High-fiber diet.  Avoid processed foods.  Needs better lipid lowering therapy.  Stop atorvastatin.  Start rosuvastatin 40 mg daily.  Liver and lipids in 3 months. Leg edema: Elevate legs. PreDM: Diet recommendations as noted above.  Exercise to the target below.  Last A1c was 6.9.  No medications have been started.  Could consider metformin. Obesity: Blood pressure will improve with weight loss.   Current medicines are reviewed at length with the patient today.  The patient concerns regarding her medicines were addressed.  The following changes have been made:  No change  Labs/ tests ordered today include:  No orders of the defined types were placed in this encounter.   Recommend 150 minutes/week of aerobic exercise Low fat, low carb, high fiber diet recommended  Disposition:   FU in 6 months   Signed, Larae Grooms, MD  11/08/2021 8:46 AM    [facility] [address], [place], [place]  [address] Phone: [phone]; Fax: [phone]

## 2021-11-08 ENCOUNTER — Ambulatory Visit: Payer: Commercial Managed Care - HMO | Attending: Interventional Cardiology | Admitting: Interventional Cardiology

## 2021-11-08 ENCOUNTER — Encounter: Payer: Self-pay | Admitting: Interventional Cardiology

## 2021-11-08 VITALS — BP 150/80 | Ht 60.0 in | Wt 169.0 lb

## 2021-11-08 DIAGNOSIS — E782 Mixed hyperlipidemia: Secondary | ICD-10-CM | POA: Diagnosis not present

## 2021-11-08 DIAGNOSIS — E669 Obesity, unspecified: Secondary | ICD-10-CM

## 2021-11-08 DIAGNOSIS — R7303 Prediabetes: Secondary | ICD-10-CM

## 2021-11-08 DIAGNOSIS — I10 Essential (primary) hypertension: Secondary | ICD-10-CM | POA: Diagnosis not present

## 2021-11-08 DIAGNOSIS — M25472 Effusion, left ankle: Secondary | ICD-10-CM

## 2021-11-08 DIAGNOSIS — M25471 Effusion, right ankle: Secondary | ICD-10-CM | POA: Diagnosis not present

## 2021-11-08 MED ORDER — ROSUVASTATIN CALCIUM 40 MG PO TABS
40.0000 mg | ORAL_TABLET | Freq: Every day | ORAL | 3 refills | Status: DC
  Filled 2022-04-16 – 2022-06-18 (×4): qty 90, 90d supply, fill #0
  Filled 2022-09-10: qty 90, 90d supply, fill #1

## 2021-11-08 NOTE — Patient Instructions (Signed)
Medication Instructions:  Your physician has recommended you make the following change in your medication: Stop atorvastatin. Start Rosuvastatin 40 mg by mouth daily  *If you need a refill on your cardiac medications before your next appointment, please call your pharmacy*   Lab Work: Your physician recommends that you return for lab work in: early December.  Lipid and liver profiles.  This will be fasting.  The lab opens at 7:15 AM  If you have labs (blood work) drawn today and your tests are completely normal, you will receive your results only by: MyChart Message (if you have MyChart) OR A paper copy in the mail If you have any lab test that is abnormal or we need to change your treatment, we will call you to review the results.   Testing/Procedures: none   Follow-Up: At [facility], you and your health needs are our priority.  As part of our continuing mission to provide you with exceptional heart care, we have created designated Provider Care Teams.  These Care Teams include your primary Cardiologist (physician) and Advanced Practice Providers (APPs -  Physician Assistants and Nurse Practitioners) who all work together to provide you with the care you need, when you need it.  We recommend signing up for the patient portal called "MyChart".  Sign up information is provided on this After Visit Summary.  MyChart is used to connect with patients for Virtual Visits (Telemedicine).  Patients are able to view lab/test results, encounter notes, upcoming appointments, etc.  Non-urgent messages can be sent to your provider as well.   To learn more about what you can do with MyChart, go to [email or web address].    Your next appointment:   6 month(s)  The format for your next appointment:   In Person  Provider:   Lance Muss, MD     Other Instructions    Important Information About Sugar

## 2022-01-10 ENCOUNTER — Other Ambulatory Visit: Payer: Self-pay | Admitting: Family Medicine

## 2022-01-10 MED ORDER — OLMESARTAN MEDOXOMIL 40 MG PO TABS
20.0000 mg | ORAL_TABLET | Freq: Two times a day (BID) | ORAL | 1 refills | Status: DC
  Filled 2022-04-16: qty 90, 90d supply, fill #0

## 2022-01-14 ENCOUNTER — Other Ambulatory Visit: Payer: Self-pay | Admitting: Family Medicine

## 2022-01-14 DIAGNOSIS — M7989 Other specified soft tissue disorders: Secondary | ICD-10-CM

## 2022-02-15 ENCOUNTER — Ambulatory Visit: Payer: Commercial Managed Care - HMO | Attending: Interventional Cardiology

## 2022-02-15 DIAGNOSIS — E782 Mixed hyperlipidemia: Secondary | ICD-10-CM

## 2022-02-15 LAB — LIPID PANEL
Chol/HDL Ratio: 2.3 ratio (ref 0.0–4.4)
Cholesterol, Total: 152 mg/dL (ref 100–199)
HDL: 65 mg/dL (ref >39)
LDL Chol Calc (NIH): 74 mg/dL (ref 0–99)
Triglycerides: 66 mg/dL (ref 0–149)
VLDL Cholesterol Cal: 13 mg/dL (ref 5–40)

## 2022-02-15 LAB — HEPATIC FUNCTION PANEL
ALT: 15 IU/L (ref 0–32)
AST: 14 IU/L (ref 0–40)
Albumin: 4.3 g/dL (ref 3.9–4.9)
Alkaline Phosphatase: 92 IU/L (ref 44–121)
Bilirubin Total: 0.3 mg/dL (ref 0.0–1.2)
Bilirubin, Direct: 0.13 mg/dL (ref 0.00–0.40)
Total Protein: 6.7 g/dL (ref 6.0–8.5)

## 2022-04-02 IMAGING — RF DG ESOPHAGUS
3 series · 9 of 9 positions shown · non-contrast
Comparison: None.

CLINICAL DATA: Nausea and vomiting.  Acid reflux.

EXAM:
ESOPHOGRAM / BARIUM SWALLOW / BARIUM TABLET STUDY
TECHNIQUE: Combined double contrast and single contrast examination performed
using effervescent crystals, thick barium liquid, and thin barium
liquid. The patient was observed with fluoroscopy swallowing a 13 mm
barium sulphate tablet.
FLUOROSCOPY TIME:  Fluoroscopy Time:  1 minutes 0 seconds
Radiation Exposure Index (if provided by the fluoroscopic device):
Number of Acquired Spot Images: 7

[Series 1: cp_standard · 0.37mm/px · 4 of 158 frames shown (1 of 3)]
[frame 11/158]
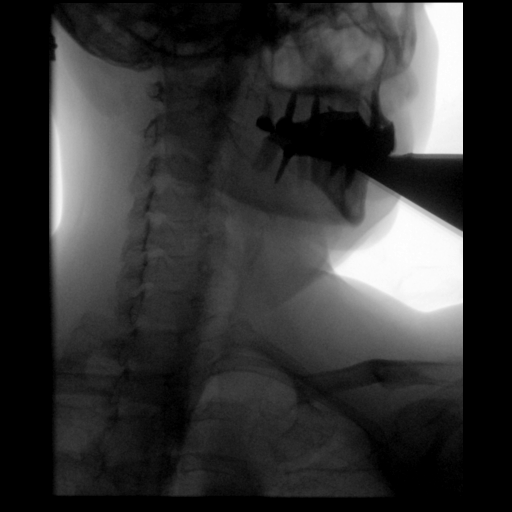
[frame 24/158]
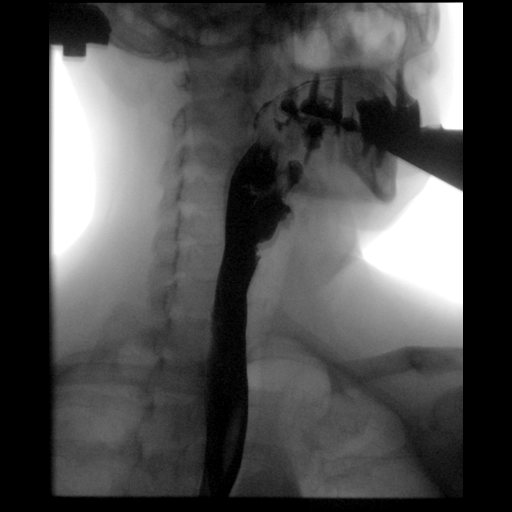
[frame 80/158]
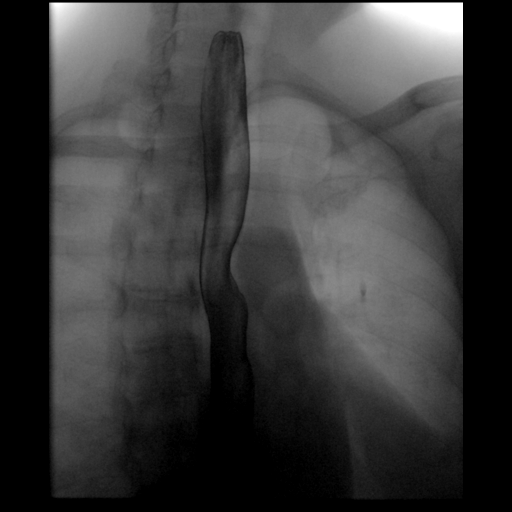
[frame 135/158]
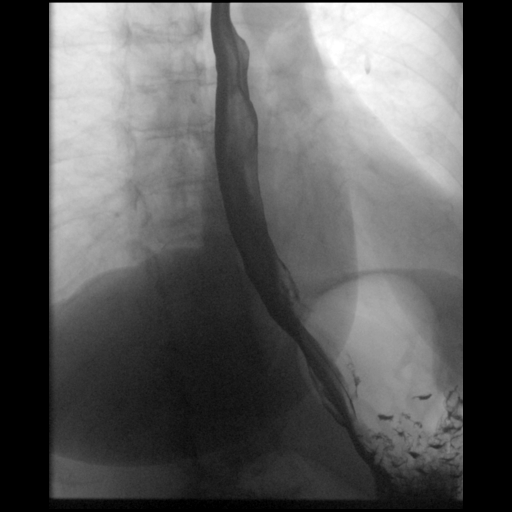

[Series 3: cp_standard · 0.42mm/px · 4 of 155 frames shown (2 of 3)]
[frame 24/155]
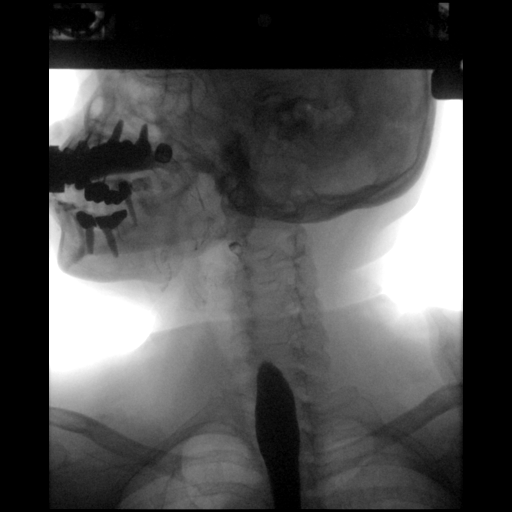
[frame 31/155]
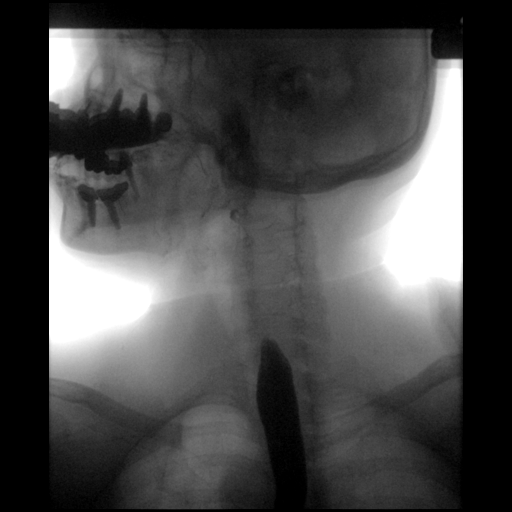
[frame 78/155]
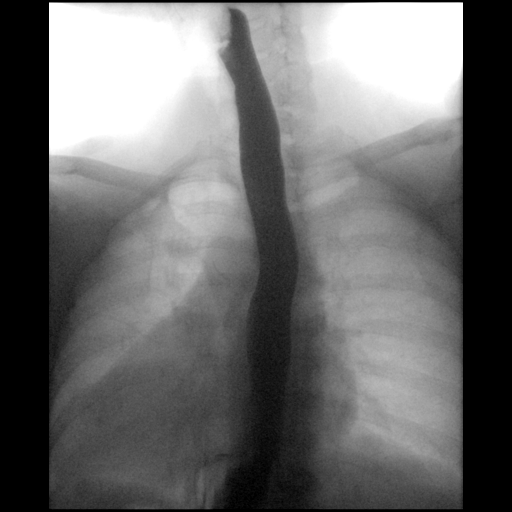
[frame 132/155]
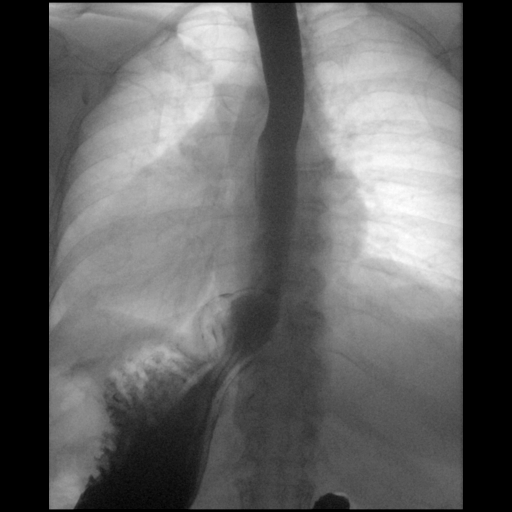

[Series 6: cp_standard · 0.19mm/px · 1 of 1 slices shown (3 of 3)]
[im 1/1]
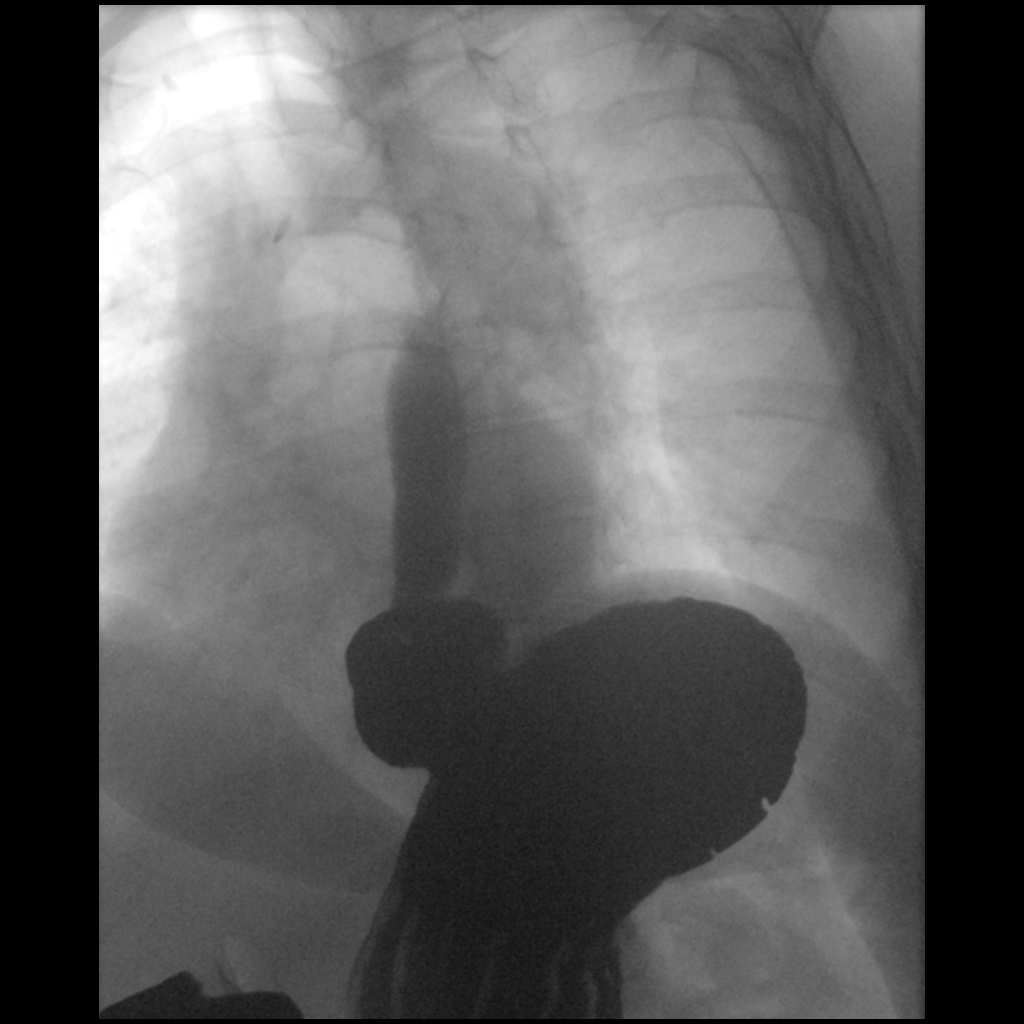

[9 of 9 positions shown; findings below may reference images not displayed]

FINDINGS: Normal pharyngeal swallowing. Esophageal mucosa and motility normal.
No stricture or mass.

Moderate hiatal hernia. Moderate gastroesophageal reflux to the
midesophagus.

Barium tablet passed readily in the stomach.  No stricture.
IMPRESSION: Hiatal hernia with moderate gastroesophageal reflux. Negative for
stricture.

## 2022-04-04 ENCOUNTER — Other Ambulatory Visit (HOSPITAL_BASED_OUTPATIENT_CLINIC_OR_DEPARTMENT_OTHER): Payer: Self-pay

## 2022-04-05 ENCOUNTER — Other Ambulatory Visit: Payer: Self-pay | Admitting: Family Medicine

## 2022-04-05 ENCOUNTER — Other Ambulatory Visit (HOSPITAL_BASED_OUTPATIENT_CLINIC_OR_DEPARTMENT_OTHER): Payer: Self-pay

## 2022-04-05 DIAGNOSIS — M7989 Other specified soft tissue disorders: Secondary | ICD-10-CM

## 2022-04-05 DIAGNOSIS — J309 Allergic rhinitis, unspecified: Secondary | ICD-10-CM

## 2022-04-05 MED ORDER — FUROSEMIDE 40 MG PO TABS
40.0000 mg | ORAL_TABLET | Freq: Every day | ORAL | 1 refills | Status: AC | PRN
  Filled 2022-04-05: qty 90, fill #0

## 2022-04-05 MED ORDER — FLUTICASONE PROPIONATE 50 MCG/ACT NA SUSP
2.0000 | Freq: Every day | NASAL | 6 refills | Status: DC
  Filled 2022-04-05: qty 16, fill #0
  Filled 2022-04-16: qty 16, 30d supply, fill #0
  Filled 2022-05-16: qty 16, 30d supply, fill #1
  Filled 2022-06-15: qty 16, 30d supply, fill #2

## 2022-04-16 ENCOUNTER — Other Ambulatory Visit: Payer: Self-pay

## 2022-04-16 ENCOUNTER — Other Ambulatory Visit (HOSPITAL_BASED_OUTPATIENT_CLINIC_OR_DEPARTMENT_OTHER): Payer: Self-pay

## 2022-04-16 MED FILL — Guanfacine HCl Tab 1 MG: ORAL | 30 days supply | Qty: 60 | Fill #0 | Status: AC

## 2022-04-18 ENCOUNTER — Other Ambulatory Visit (HOSPITAL_BASED_OUTPATIENT_CLINIC_OR_DEPARTMENT_OTHER): Payer: Self-pay

## 2022-04-18 ENCOUNTER — Ambulatory Visit (INDEPENDENT_AMBULATORY_CARE_PROVIDER_SITE_OTHER): Payer: 59 | Admitting: Family Medicine

## 2022-04-18 ENCOUNTER — Encounter: Payer: Self-pay | Admitting: Family Medicine

## 2022-04-18 VITALS — BP 124/78 | HR 46 | Temp 97.9°F | Ht 60.0 in | Wt 167.1 lb

## 2022-04-18 DIAGNOSIS — E669 Obesity, unspecified: Secondary | ICD-10-CM

## 2022-04-18 DIAGNOSIS — Z0000 Encounter for general adult medical examination without abnormal findings: Secondary | ICD-10-CM

## 2022-04-18 DIAGNOSIS — E1169 Type 2 diabetes mellitus with other specified complication: Secondary | ICD-10-CM

## 2022-04-18 DIAGNOSIS — B353 Tinea pedis: Secondary | ICD-10-CM

## 2022-04-18 LAB — COMPREHENSIVE METABOLIC PANEL
ALT: 15 U/L (ref 0–35)
AST: 14 U/L (ref 0–37)
Albumin: 4.2 g/dL (ref 3.5–5.2)
Alkaline Phosphatase: 72 U/L (ref 39–117)
BUN: 14 mg/dL (ref 6–23)
CO2: 29 mEq/L (ref 19–32)
Calcium: 9.2 mg/dL (ref 8.4–10.5)
Chloride: 104 mEq/L (ref 96–112)
Creatinine, Ser: 0.82 mg/dL (ref 0.40–1.20)
GFR: 74.79 mL/min (ref >60.00)
Glucose, Bld: 120 mg/dL — ABNORMAL HIGH (ref 70–99)
Potassium: 4.8 mEq/L (ref 3.5–5.1)
Sodium: 140 mEq/L (ref 135–145)
Total Bilirubin: 0.4 mg/dL (ref 0.2–1.2)
Total Protein: 6.9 g/dL (ref 6.0–8.3)

## 2022-04-18 LAB — LIPID PANEL
Cholesterol: 154 mg/dL (ref 0–200)
HDL: 57.8 mg/dL (ref >39.00)
LDL Cholesterol: 82 mg/dL (ref 0–99)
NonHDL: 96.58
Total CHOL/HDL Ratio: 3
Triglycerides: 72 mg/dL (ref 0.0–149.0)
VLDL: 14.4 mg/dL (ref 0.0–40.0)

## 2022-04-18 LAB — HEMOGLOBIN A1C: Hgb A1c MFr Bld: 6.7 % — ABNORMAL HIGH (ref 4.6–6.5)

## 2022-04-18 LAB — CBC
HCT: 38.6 % (ref 36.0–46.0)
Hemoglobin: 12.5 g/dL (ref 12.0–15.0)
MCHC: 32.5 g/dL (ref 30.0–36.0)
MCV: 87.9 fl (ref 78.0–100.0)
Platelets: 177 10*3/uL (ref 150.0–400.0)
RBC: 4.39 Mil/uL (ref 3.87–5.11)
RDW: 13.4 % (ref 11.5–15.5)
WBC: 4.8 10*3/uL (ref 4.0–10.5)

## 2022-04-18 LAB — MICROALBUMIN / CREATININE URINE RATIO
Creatinine,U: 16 mg/dL
Microalb Creat Ratio: 4.4 mg/g (ref 0.0–30.0)
Microalb, Ur: <0.7 mg/dL (ref 0.0–1.9)

## 2022-04-18 MED ORDER — KETOCONAZOLE 2 % EX CREA
1.0000 | TOPICAL_CREAM | Freq: Every day | CUTANEOUS | 0 refills | Status: AC
  Filled 2022-04-18: qty 30, 30d supply, fill #0

## 2022-04-18 NOTE — Progress Notes (Signed)
Chief Complaint  Patient presents with   Annual Exam    Experiencing being cold at nights     Well Woman Elizabeth [name] is here for a complete physical.   Her last physical was >1 year ago.  Current diet: in general, a "healthy" diet. Current exercise: walking. Weight is stable and she denies daytime fatigue. Seatbelt? Yes Advanced directive? No  Health Maintenance CCS- Yes Shingrix- No DEXA- Yes Mammogram- Yes Tetanus- Yes Pneumonia- Yes Hep C screen- Yes  Past Medical History:  Diagnosis Date   Diabetes mellitus type 2 in obese (HCC)    Hypertension      Past Surgical History:  Procedure Laterality Date   CESAREAN SECTION      Medications  Current Outpatient Medications on File Prior to Visit  Medication Sig Dispense Refill   amLODipine (NORVASC) 5 MG tablet Take 1 tablet (5 mg total) by mouth 2 (two) times daily. 180 tablet 3   blood glucose meter kit and supplies KIT daily as needed. Dispense based on patient and insurance preference. Use up to once  daily as directed.     fluticasone (FLONASE) 50 MCG/ACT nasal spray Place 2 sprays into both nostrils daily. 16 g 6   furosemide (LASIX) 40 MG tablet Take 1 tablet (40 mg total) by mouth daily as needed for fluid retention or swelling. 90 tablet 1   glucose blood (IGLUCOSE TEST STRIPS) test strip Dispense based on patient and insurance preference. Test once daily to check blood sugar. 100 each 12   guanFACINE (TENEX) 1 MG tablet Take 2 tablets (2 mg total) by mouth at bedtime. 60 tablet 3   Lancets MISC Dispense based on patient and insurance preference.  Test once daily to check blood sugar.     metoprolol tartrate (LOPRESSOR) 100 MG tablet Take 1 tablet (100 mg total) by mouth 2 (two) times daily. 180 tablet 3   metoprolol tartrate (LOPRESSOR) 50 MG tablet TAKE 1 TABLET BY MOUTH EVERY DAY AS NEEDED IF BLOOD PRESSURE IS 150-180 30 tablet 3   olmesartan (BENICAR) 40 MG tablet Take 0.5 tablets (20 mg total) by mouth in  the morning and at bedtime. 90 tablet 1   RABEprazole (ACIPHEX) 20 MG tablet Take 1 tablet (20 mg total) by mouth 2 (two) times daily. 180 tablet 2   rosuvastatin (CRESTOR) 40 MG tablet Take 1 tablet (40 mg total) by mouth daily. 90 tablet 3   Allergies No Known Allergies  Review of Systems: Constitutional:  no fevers Eye:  no recent significant change in vision Ears:  No changes in hearing Nose/Mouth/Throat:  no complaints of nasal congestion, no sore throat Cardiovascular: no chest pain Respiratory:  No shortness of breath Gastrointestinal:  No change in bowel habits GU:  Female: negative for dysuria Integumentary:  no abnormal skin lesions reported Neurologic:  no headaches Endocrine:  denies unexplained weight changes  Exam BP 124/78 (BP Location: Left Arm, Patient Position: Sitting, Cuff Size: Normal)   Pulse (!) 46   Temp 97.9 F (36.6 C) (Oral)   Ht 5' (1.524 m)   Wt 167 lb 2 oz (75.8 kg)   SpO2 99%   BMI 32.64 kg/m  General:  well developed, well nourished, in no apparent distress Skin: Macerated tissue between digits 4/5 on the right foot, otherwise no significant moles, warts, or growths Head:  no masses, lesions, or tenderness Eyes:  pupils equal and round, sclera anicteric without injection Ears:  canals without lesions, TMs shiny without retraction, no  obvious effusion, no erythema Nose:  nares patent, mucosa normal, and no drainage Throat/Pharynx:  lips and gingiva without lesion; tongue and uvula midline; non-inflamed pharynx; no exudates or postnasal drainage Neck: neck supple without adenopathy, thyromegaly, or masses Lungs:  clear to auscultation, breath sounds equal bilaterally, no respiratory distress Cardio:  regular rate and rhythm, no bruits or LE edema Abdomen:  abdomen soft, nontender; bowel sounds normal; no masses or organomegaly Genital: Deferred Neuro: Sensation intact to pinprick bilaterally, gait normal; deep tendon reflexes normal and  symmetric Psych: well oriented with normal range of affect and appropriate judgment/insight  Assessment and Plan  Well adult exam - Plan: CBC, Comprehensive metabolic panel, Lipid panel  Diabetes mellitus type 2 in obese ([place]) - Plan: Microalbumin / creatinine urine ratio, Hemoglobin A1c  Tinea pedis of right foot - Plan: ketoconazole (NIZORAL) 2 % cream   Well 67 y.o. female. Counseled on diet and exercise. Advanced directive form provided today.  Shingrix rec'd.  Tinea pedis: Daily ketoconazole for 6 weeks. Other orders as above. Follow up in 6 months. The patient voiced understanding and agreement to the plan.  [place], DO 04/18/22 8:01 AM

## 2022-04-18 NOTE — Patient Instructions (Signed)
Give us 2-3 business days to get the results of your labs back.   Keep the diet clean and stay active.  Please get me a copy of your advanced directive form at your convenience.   Let us know if you need anything.  

## 2022-04-24 ENCOUNTER — Telehealth: Payer: Self-pay

## 2022-04-24 NOTE — Telephone Encounter (Signed)
**Note De-Identified Elizabeth [name] Obfuscation** Rosuvastatin PA started through covermymeds. Key: [id number]

## 2022-04-25 ENCOUNTER — Other Ambulatory Visit (HOSPITAL_BASED_OUTPATIENT_CLINIC_OR_DEPARTMENT_OTHER): Payer: Self-pay

## 2022-04-25 NOTE — Telephone Encounter (Signed)
**Note De-Identified Aranda Bihm Obfuscation** Mack Guise (Key: [id number]) PA Case ID #[id number] Rx #[id number] Outcome: Approved today Your PA request has been approved. Additional information will be provided in the approval communication. (Message 1145) Authorization Expiration Date: 04/24/2023 Drug Rosuvastatin Calcium 40MG tablets ePA cloud Child psychotherapist Electronic PA Form [phone] NCPDP) Original Claim Info  I have notified [place] (Ph: [phone]) of this approval.

## 2022-04-26 ENCOUNTER — Other Ambulatory Visit (HOSPITAL_BASED_OUTPATIENT_CLINIC_OR_DEPARTMENT_OTHER): Payer: Self-pay

## 2022-05-04 ENCOUNTER — Other Ambulatory Visit (HOSPITAL_BASED_OUTPATIENT_CLINIC_OR_DEPARTMENT_OTHER): Payer: Self-pay

## 2022-05-14 ENCOUNTER — Ambulatory Visit: Payer: 59 | Admitting: Interventional Cardiology

## 2022-05-16 ENCOUNTER — Other Ambulatory Visit (HOSPITAL_BASED_OUTPATIENT_CLINIC_OR_DEPARTMENT_OTHER): Payer: Self-pay

## 2022-05-16 ENCOUNTER — Other Ambulatory Visit: Payer: Self-pay | Admitting: Family Medicine

## 2022-05-16 ENCOUNTER — Telehealth: Payer: Self-pay

## 2022-05-16 DIAGNOSIS — I10 Essential (primary) hypertension: Secondary | ICD-10-CM

## 2022-05-16 MED ORDER — METOPROLOL TARTRATE 100 MG PO TABS
100.0000 mg | ORAL_TABLET | Freq: Two times a day (BID) | ORAL | 3 refills | Status: DC
  Filled 2022-05-16 – 2022-07-23 (×5): qty 180, 90d supply, fill #0
  Filled 2022-09-11 – 2022-10-16 (×2): qty 180, 90d supply, fill #1
  Filled 2023-01-14: qty 180, 90d supply, fill #2

## 2022-05-16 MED FILL — Guanfacine HCl Tab 1 MG: ORAL | 30 days supply | Qty: 60 | Fill #1 | Status: AC

## 2022-05-16 NOTE — Telephone Encounter (Signed)
PA initiated via Covermymeds; KEY: [id number]. Awaiting determination.

## 2022-05-17 ENCOUNTER — Other Ambulatory Visit (HOSPITAL_BASED_OUTPATIENT_CLINIC_OR_DEPARTMENT_OTHER): Payer: Self-pay

## 2022-05-18 NOTE — Telephone Encounter (Signed)
Received Clarification fax/completed and faxed back.

## 2022-05-21 ENCOUNTER — Other Ambulatory Visit (HOSPITAL_BASED_OUTPATIENT_CLINIC_OR_DEPARTMENT_OTHER): Payer: Self-pay

## 2022-05-21 MED ORDER — OMEPRAZOLE 40 MG PO CPDR: 40.0000 mg | DELAYED_RELEASE_CAPSULE | Freq: Every day | ORAL | 3 refills | Status: DC

## 2022-05-21 NOTE — Telephone Encounter (Signed)
PA denied.    trial and failure for at least one month or unable to use two of the following: lansoprazole, omeprazole, and pantoprazole

## 2022-05-21 NOTE — Telephone Encounter (Signed)
Called and informed the patients husband of instructions. He verbalized understanding.

## 2022-05-21 NOTE — Telephone Encounter (Signed)
She has failed Protonix. Will send omeprazole for 1 mo. If it works, Engineer, manufacturing. If it doesn't work, we will send in Aciphex again. Ty.

## 2022-06-15 ENCOUNTER — Other Ambulatory Visit: Payer: Self-pay | Admitting: Family Medicine

## 2022-06-15 ENCOUNTER — Other Ambulatory Visit (HOSPITAL_BASED_OUTPATIENT_CLINIC_OR_DEPARTMENT_OTHER): Payer: Self-pay

## 2022-06-15 ENCOUNTER — Other Ambulatory Visit: Payer: Self-pay

## 2022-06-15 MED ORDER — GUANFACINE HCL 1 MG PO TABS
2.0000 mg | ORAL_TABLET | Freq: Every day | ORAL | 3 refills | Status: DC
  Filled 2022-06-15: qty 60, 30d supply, fill #0
  Filled 2022-07-16 – 2022-07-23 (×2): qty 60, 30d supply, fill #1
  Filled 2022-08-17: qty 60, 30d supply, fill #2
  Filled 2022-09-11: qty 60, 30d supply, fill #3

## 2022-06-15 MED ORDER — OLMESARTAN MEDOXOMIL 40 MG PO TABS
20.0000 mg | ORAL_TABLET | Freq: Two times a day (BID) | ORAL | 1 refills | Status: DC
  Filled 2022-06-15 – 2022-06-28 (×4): qty 90, 90d supply, fill #0
  Filled 2022-07-26: qty 60, 60d supply, fill #1
  Filled 2022-08-14 – 2022-09-18 (×4): qty 90, 90d supply, fill #1

## 2022-06-18 ENCOUNTER — Other Ambulatory Visit (HOSPITAL_BASED_OUTPATIENT_CLINIC_OR_DEPARTMENT_OTHER): Payer: Self-pay

## 2022-06-22 ENCOUNTER — Ambulatory Visit: Payer: 59 | Admitting: Physician Assistant

## 2022-06-28 ENCOUNTER — Other Ambulatory Visit (HOSPITAL_BASED_OUTPATIENT_CLINIC_OR_DEPARTMENT_OTHER): Payer: Self-pay

## 2022-07-10 ENCOUNTER — Other Ambulatory Visit (HOSPITAL_BASED_OUTPATIENT_CLINIC_OR_DEPARTMENT_OTHER): Payer: Self-pay

## 2022-07-10 ENCOUNTER — Other Ambulatory Visit: Payer: Self-pay

## 2022-07-20 ENCOUNTER — Other Ambulatory Visit (HOSPITAL_BASED_OUTPATIENT_CLINIC_OR_DEPARTMENT_OTHER): Payer: Self-pay

## 2022-07-23 ENCOUNTER — Other Ambulatory Visit: Payer: Self-pay

## 2022-07-26 ENCOUNTER — Other Ambulatory Visit (HOSPITAL_BASED_OUTPATIENT_CLINIC_OR_DEPARTMENT_OTHER): Payer: Self-pay

## 2022-08-14 ENCOUNTER — Other Ambulatory Visit (HOSPITAL_BASED_OUTPATIENT_CLINIC_OR_DEPARTMENT_OTHER): Payer: Self-pay

## 2022-08-17 ENCOUNTER — Other Ambulatory Visit (HOSPITAL_BASED_OUTPATIENT_CLINIC_OR_DEPARTMENT_OTHER): Payer: Self-pay

## 2022-09-04 ENCOUNTER — Ambulatory Visit (INDEPENDENT_AMBULATORY_CARE_PROVIDER_SITE_OTHER): Payer: 59 | Admitting: Family Medicine

## 2022-09-04 ENCOUNTER — Encounter: Payer: Self-pay | Admitting: Family Medicine

## 2022-09-04 VITALS — BP 139/79 | HR 50 | Temp 98.0°F | Ht 60.0 in | Wt 166.0 lb

## 2022-09-04 DIAGNOSIS — D1720 Benign lipomatous neoplasm of skin and subcutaneous tissue of unspecified limb: Secondary | ICD-10-CM

## 2022-09-04 DIAGNOSIS — M65331 Trigger finger, right middle finger: Secondary | ICD-10-CM | POA: Diagnosis not present

## 2022-09-04 DIAGNOSIS — M65332 Trigger finger, left middle finger: Secondary | ICD-10-CM | POA: Diagnosis not present

## 2022-09-04 MED ORDER — METHYLPREDNISOLONE ACETATE 40 MG/ML IJ SUSP
20.0000 mg | Freq: Once | INTRAMUSCULAR | Status: AC
  Administered 2022-09-04: 20 mg via INTRA_ARTICULAR

## 2022-09-04 NOTE — Progress Notes (Signed)
Musculoskeletal Exam  Patient: Elizabeth [name] DOB: 1955/05/14  DOS: 09/04/2022  SUBJECTIVE:  Chief Complaint:   Chief Complaint  Patient presents with   Hand Pain    Swelling feet    Elizabeth [name] is a 67 y.o.  female for evaluation and treatment of R hand pain. Here w spouse.    Onset:  1 month ago. No inj or change in activity.  Location: middle fingers on both hands, worse on L Character:  sharp  Progression of issue:  has worsened Associated symptoms: locking of finger No swelling, redness, bruising, decreased range of motion Treatment: to date has been ice.   Neurovascular symptoms: no  Skin lesions Rubbery nodules noted on her heels.  There for a couple weeks.  No new lotions, soaps, topicals, detergents.  There is no itching, pain, drainage, redness.  It does not bother her when she walks.  Past Medical History:  Diagnosis Date   Diabetes mellitus type 2 in obese (HCC)    Hypertension     Objective: VITAL SIGNS: BP 139/79 (BP Location: Right Arm, Patient Position: Sitting, Cuff Size: Normal)   Pulse (!) 50   Temp 98 F (36.7 C) (Oral)   Ht 5' (1.524 m)   Wt 166 lb (75.3 kg)   SpO2 98%   BMI 32.42 kg/m  Constitutional: Well formed, well developed. No acute distress. Thorax & Lungs: No accessory muscle use Musculoskeletal: Third digits bilaterally.   Normal active range of motion: yes.   Normal passive range of motion: yes Tenderness to palpation: Yes over the flexor tendon near the palmar crease bilaterally, worse on the left Deformity: no Ecchymosis: no Neurologic: Normal sensory function.  Skin: Rubbery elliptically shaped lesions over the lateral portion of the calcaneal tendon bilaterally.  There is no TTP, erythema, fluctuance, drainage, or excessive warmth. Psychiatric: Normal mood. Age appropriate judgment and insight. Alert & oriented x 3.   Procedure note: Trigger finger injection Verbal consent obtained. The area of interest was palpated and  just distal over the palmar MCP crease, the tendon was marked with an otoscope speculum. The area marked by a speculum was cleaned with alcohol and freeze spray was used for topical anesthesia. A 30-gauge needle was inserted at a 45 degree angle and once under the skin, continued in parallel fashion to the tendon, and used to inject 20 mg of Depo-Medrol and 0.5 mg of 1% lidocaine without epinephrine.  The plunger of the syringe was withdrawn prior to injecting the medication to ensure the needle was not in a blood vessel. A Band-Aid was placed. The patient tolerated the procedure well with no immediate complications noted.   Assessment:  Trigger middle finger of left hand - Plan: methylPREDNISolone acetate (DEPO-MEDROL) injection 20 mg, PR INJECTION 1 TENDON SHEATH/LIGAMENT APONEUROSIS  Trigger finger, right middle finger  Lipoma of lower extremity, unspecified laterality  Plan: Steroid injection today. Heat, ice, Tylenol. Hand referral if no better.  Aluminum frog splint provided to prevent flexion of her right middle finger to help with her symptoms. Injection vs referral if no better. Reassurance.  F/u prn. The patient and her spouse voiced understanding and agreement to the plan.   Elizabeth Roche [place], DO 09/04/22  12:19 PM

## 2022-09-04 NOTE — Patient Instructions (Signed)
For the areas on your ankles/achilles heel, keep an eye on things and let me know if anything changes. Otherwise, this shouldn't be a bother.  Ice/cold pack over area for 10-15 min twice daily.  OK to take Tylenol 1000 mg (2 extra strength tabs) or 975 mg (3 regular strength tabs) every 6 hours as needed.  Let us know if you need anything.

## 2022-09-10 ENCOUNTER — Other Ambulatory Visit (HOSPITAL_BASED_OUTPATIENT_CLINIC_OR_DEPARTMENT_OTHER): Payer: Self-pay

## 2022-09-11 ENCOUNTER — Other Ambulatory Visit: Payer: Self-pay

## 2022-09-12 ENCOUNTER — Other Ambulatory Visit (HOSPITAL_BASED_OUTPATIENT_CLINIC_OR_DEPARTMENT_OTHER): Payer: Self-pay

## 2022-09-19 ENCOUNTER — Other Ambulatory Visit (HOSPITAL_BASED_OUTPATIENT_CLINIC_OR_DEPARTMENT_OTHER): Payer: Self-pay

## 2022-10-10 ENCOUNTER — Other Ambulatory Visit: Payer: Self-pay | Admitting: Family Medicine

## 2022-10-10 ENCOUNTER — Other Ambulatory Visit (HOSPITAL_BASED_OUTPATIENT_CLINIC_OR_DEPARTMENT_OTHER): Payer: Self-pay

## 2022-10-10 MED ORDER — OLMESARTAN MEDOXOMIL 40 MG PO TABS
20.0000 mg | ORAL_TABLET | Freq: Two times a day (BID) | ORAL | 1 refills | Status: DC
  Filled 2022-10-10 – 2023-01-07 (×3): qty 90, 90d supply, fill #0

## 2022-10-16 ENCOUNTER — Other Ambulatory Visit: Payer: Self-pay | Admitting: Family Medicine

## 2022-10-16 ENCOUNTER — Other Ambulatory Visit: Payer: Self-pay

## 2022-10-16 ENCOUNTER — Other Ambulatory Visit (HOSPITAL_BASED_OUTPATIENT_CLINIC_OR_DEPARTMENT_OTHER): Payer: Self-pay

## 2022-10-16 MED ORDER — AMLODIPINE BESYLATE 5 MG PO TABS
5.0000 mg | ORAL_TABLET | Freq: Two times a day (BID) | ORAL | 3 refills | Status: DC
  Filled 2022-10-16: qty 180, 90d supply, fill #0
  Filled 2023-01-10: qty 180, 90d supply, fill #1
  Filled 2023-04-10: qty 180, 90d supply, fill #2
  Filled 2023-07-09: qty 180, 90d supply, fill #3

## 2022-10-16 MED ORDER — GUANFACINE HCL 1 MG PO TABS
2.0000 mg | ORAL_TABLET | Freq: Every day | ORAL | 3 refills | Status: DC
  Filled 2022-10-16: qty 60, 30d supply, fill #0
  Filled 2022-11-12: qty 60, 30d supply, fill #1
  Filled 2022-12-11: qty 60, 30d supply, fill #2
  Filled 2023-01-10: qty 60, 30d supply, fill #3

## 2022-10-19 ENCOUNTER — Ambulatory Visit: Payer: 59 | Admitting: Family Medicine

## 2022-10-30 ENCOUNTER — Other Ambulatory Visit (HOSPITAL_BASED_OUTPATIENT_CLINIC_OR_DEPARTMENT_OTHER): Payer: Self-pay

## 2022-11-05 ENCOUNTER — Encounter: Payer: Self-pay | Admitting: Family Medicine

## 2022-11-05 ENCOUNTER — Ambulatory Visit (INDEPENDENT_AMBULATORY_CARE_PROVIDER_SITE_OTHER): Payer: 59 | Admitting: Family Medicine

## 2022-11-05 VITALS — BP 128/70 | HR 47 | Temp 98.8°F | Ht 60.0 in | Wt 169.1 lb

## 2022-11-05 DIAGNOSIS — E1169 Type 2 diabetes mellitus with other specified complication: Secondary | ICD-10-CM

## 2022-11-05 DIAGNOSIS — E669 Obesity, unspecified: Secondary | ICD-10-CM | POA: Diagnosis not present

## 2022-11-05 DIAGNOSIS — E782 Mixed hyperlipidemia: Secondary | ICD-10-CM

## 2022-11-05 DIAGNOSIS — I10 Essential (primary) hypertension: Secondary | ICD-10-CM

## 2022-11-05 NOTE — Progress Notes (Signed)
Subjective:   Chief Complaint  Patient presents with   Follow-up    Elizabeth [name] is a 67 y.o. female here for follow-up of diabetes.   Kasidi's self monitored glucose range is low 100's.  Patient denies hypoglycemic reactions. She checks her glucose levels intermittently Patient does not require insulin.   Medications include: diet controlled Diet is healthy overall.  Exercise: walking  Hypertension Patient presents for hypertension follow up. She does monitor home blood pressures. Blood pressures ranging on average from 130's/70's. She is compliant with medications- Benicar 40 mg/d, Norvasc 5 mg bid, metoprolol tart 100 mg bid. Patient has these side effects of medication: none Diet/exercise as above.  No chest pain or shortness of breath.  Mixed hyperlipidemia Patient presents for mixed hyperlipidemia follow up. Currently being treated with Crestor 40 mg daily and compliance with treatment thus far has been good. She denies myalgias. Diet/exercise as above. The patient is not known to have coexisting coronary artery disease.  Past Medical History:  Diagnosis Date   Diabetes mellitus type 2 in obese    Hypertension      Related testing: Retinal exam: Due Pneumovax: done  Objective:  BP 128/70 (BP Location: Right Arm, Patient Position: Sitting, Cuff Size: Normal)   Pulse (!) 47   Temp 98.8 F (37.1 C) (Oral)   Ht 5' (1.524 m)   Wt 169 lb 2 oz (76.7 kg)   SpO2 98%   BMI 33.03 kg/m  General:  Well developed, well nourished, in no apparent distress Lungs:  CTAB, no access msc use Cardio:  reg rhythm, bradycardic, no bruits, 2+ pitting bilateral LE edema tapering at the proximal third of the tibia MSK: No calf TTP bilaterally Psych: Age appropriate judgment and insight  Assessment:   Type 2 diabetes mellitus with obesity (HCC) - Plan: Comprehensive metabolic panel, Hemoglobin A1c, Lipid panel, Ambulatory referral to Ophthalmology  Essential hypertension,  benign  Mixed hyperlipidemia   Plan:   Chronic, hopefully stable.  Continue diet control.  Counseled on diet and exercise. Chronic, stable.  Continue metoprolol tartrate 100 mg twice daily, Benicar 40 mg daily, amlodipine 5 mg daily. Chronic, probably stable.  Continue Crestor 40 mg daily. F/u in 6 mo. The patient voiced understanding and agreement to the plan.  Jilda Roche [place], DO 11/05/22 3:27 PM

## 2022-11-05 NOTE — Patient Instructions (Signed)
Give us 2-3 business days to get the results of your labs back.   Keep the diet clean and stay active.  If you do not hear anything about your referral in the next 1-2 weeks, call our office and ask for an update.  Let us know if you need anything. 

## 2022-11-06 LAB — COMPREHENSIVE METABOLIC PANEL
ALT: 19 U/L (ref 0–35)
AST: 17 U/L (ref 0–37)
Albumin: 4 g/dL (ref 3.5–5.2)
Alkaline Phosphatase: 85 U/L (ref 39–117)
BUN: 14 mg/dL (ref 6–23)
CO2: 31 mEq/L (ref 19–32)
Calcium: 9.7 mg/dL (ref 8.4–10.5)
Chloride: 103 meq/L (ref 96–112)
Creatinine, Ser: 0.92 mg/dL (ref 0.40–1.20)
GFR: 64.9 mL/min (ref >60.00)
Glucose, Bld: 105 mg/dL — ABNORMAL HIGH (ref 70–99)
Potassium: 4.6 meq/L (ref 3.5–5.1)
Sodium: 140 meq/L (ref 135–145)
Total Bilirubin: 0.4 mg/dL (ref 0.2–1.2)
Total Protein: 6.9 g/dL (ref 6.0–8.3)

## 2022-11-06 LAB — LIPID PANEL
Cholesterol: 162 mg/dL (ref 0–200)
HDL: 60.1 mg/dL (ref >39.00)
LDL Cholesterol: 84 mg/dL (ref 0–99)
NonHDL: 101.57
Total CHOL/HDL Ratio: 3
Triglycerides: 87 mg/dL (ref 0.0–149.0)
VLDL: 17.4 mg/dL (ref 0.0–40.0)

## 2022-11-06 LAB — HEMOGLOBIN A1C: Hgb A1c MFr Bld: 6.7 % — ABNORMAL HIGH (ref 4.6–6.5)

## 2022-11-26 ENCOUNTER — Other Ambulatory Visit (HOSPITAL_BASED_OUTPATIENT_CLINIC_OR_DEPARTMENT_OTHER): Payer: Self-pay

## 2022-12-10 ENCOUNTER — Other Ambulatory Visit: Payer: Self-pay | Admitting: Interventional Cardiology

## 2022-12-11 ENCOUNTER — Other Ambulatory Visit: Payer: Self-pay

## 2022-12-12 ENCOUNTER — Other Ambulatory Visit (HOSPITAL_BASED_OUTPATIENT_CLINIC_OR_DEPARTMENT_OTHER): Payer: Self-pay

## 2022-12-12 MED ORDER — ROSUVASTATIN CALCIUM 40 MG PO TABS
40.0000 mg | ORAL_TABLET | Freq: Every day | ORAL | 0 refills | Status: DC
  Filled 2022-12-12: qty 90, 90d supply, fill #0

## 2022-12-18 ENCOUNTER — Other Ambulatory Visit (HOSPITAL_BASED_OUTPATIENT_CLINIC_OR_DEPARTMENT_OTHER): Payer: Self-pay

## 2022-12-18 MED ORDER — FLUAD 0.5 ML IM SUSY
0.5000 mL | PREFILLED_SYRINGE | Freq: Once | INTRAMUSCULAR | 0 refills | Status: AC
  Filled 2022-12-18: qty 0.5, 1d supply, fill #0

## 2022-12-24 ENCOUNTER — Encounter: Payer: Self-pay | Admitting: Family Medicine

## 2022-12-24 ENCOUNTER — Ambulatory Visit (INDEPENDENT_AMBULATORY_CARE_PROVIDER_SITE_OTHER): Payer: 59 | Admitting: Family Medicine

## 2022-12-24 VITALS — BP 132/70 | HR 70 | Temp 97.6°F | Resp 16 | Wt 170.4 lb

## 2022-12-24 DIAGNOSIS — M65341 Trigger finger, right ring finger: Secondary | ICD-10-CM | POA: Diagnosis not present

## 2022-12-24 MED ORDER — METHYLPREDNISOLONE ACETATE 40 MG/ML IJ SUSP
20.0000 mg | Freq: Once | INTRAMUSCULAR | Status: AC
Start: 2022-12-24 — End: 2022-12-24
  Administered 2022-12-24: 20 mg via INTRA_ARTICULAR

## 2022-12-24 NOTE — Progress Notes (Signed)
Musculoskeletal Exam  Patient: Elizabeth [name] DOB: October 01, 1955  DOS: 12/24/2022  SUBJECTIVE:  Chief Complaint:   Chief Complaint  Patient presents with   Hand Pain    Finger Pain    Elizabeth [name] is a 67 y.o.  female for evaluation and treatment of R ring finger pain.   Onset:  1 month ago. No inj or change in activity.  Location: hand Character:  aching  Progression of issue:  has worsened Associated symptoms: triggering No bruising, redness, swelling Treatment: to date has been none.   Neurovascular symptoms: no  Past Medical History:  Diagnosis Date   Diabetes mellitus type 2 in obese    Hypertension     Objective: VITAL SIGNS: BP 132/70 (BP Location: Left Arm, Cuff Size: Normal)   Pulse 70   Temp 97.6 F (36.4 C) (Oral)   Resp 16   Wt 170 lb 6.4 oz (77.3 kg)   SpO2 97%   BMI 33.28 kg/m  Constitutional: Well formed, well developed. No acute distress. Thorax & Lungs: No accessory muscle use Musculoskeletal: R hand.   Finger is triggering with extension.  Tenderness to palpation: ttp over flexor tendon over distal palm Deformity: no Ecchymosis: no Neurologic: Normal sensory function. Psychiatric: Normal mood. Age appropriate judgment and insight. Alert & oriented x 3.    Procedure note: Trigger finger injection Verbal consent obtained. The area of interest was palpated and just distal over the palmar MCP crease, the tendon was marked with an otoscope speculum. The area marked by a speculum was cleaned with alcohol and freeze spray was used for topical anesthesia. A 27-gauge needle was inserted at a 45 degree angle and once under the skin, continued in parallel fashion to the tendon, and used to inject 20 mg of Depo-Medrol and 0.5 mg of 1% lidocaine without epinephrine.  The plunger of the syringe was withdrawn prior to injecting the medication to ensure the needle was not in a blood vessel. A Band-Aid was placed. The patient tolerated the procedure well  with no immediate complications noted.  Assessment:  Trigger ring finger of right hand  Plan: Heat, ice, Tylenol. Injection today.  F/u prn. The patient voiced understanding and agreement to the plan.   Jilda Roche [place], DO 12/24/22  2:17 PM

## 2022-12-24 NOTE — Patient Instructions (Signed)
OK to take Tylenol 1000 mg (2 extra strength tabs) or 975 mg (3 regular strength tabs) every 6 hours as needed. ? ?Ice/cold pack over area for 10-15 min twice daily. ? ?Let us know if you need anything. ?

## 2022-12-24 NOTE — Addendum Note (Signed)
Addended by: Scharlene Gloss B on: 12/24/2022 02:21 PM   Modules accepted: Orders

## 2023-01-07 ENCOUNTER — Other Ambulatory Visit (HOSPITAL_BASED_OUTPATIENT_CLINIC_OR_DEPARTMENT_OTHER): Payer: Self-pay

## 2023-01-08 ENCOUNTER — Ambulatory Visit: Payer: 59 | Admitting: Cardiology

## 2023-01-17 ENCOUNTER — Other Ambulatory Visit (HOSPITAL_BASED_OUTPATIENT_CLINIC_OR_DEPARTMENT_OTHER): Payer: Self-pay

## 2023-02-11 ENCOUNTER — Other Ambulatory Visit (HOSPITAL_BASED_OUTPATIENT_CLINIC_OR_DEPARTMENT_OTHER): Payer: Self-pay

## 2023-02-11 ENCOUNTER — Other Ambulatory Visit: Payer: Self-pay | Admitting: Family Medicine

## 2023-02-11 MED ORDER — GUANFACINE HCL 1 MG PO TABS
2.0000 mg | ORAL_TABLET | Freq: Every day | ORAL | 3 refills | Status: DC
  Filled 2023-02-11: qty 60, 30d supply, fill #0
  Filled 2023-03-11: qty 60, 30d supply, fill #1
  Filled 2023-04-10: qty 60, 30d supply, fill #2
  Filled 2023-05-06: qty 60, 30d supply, fill #3

## 2023-03-07 ENCOUNTER — Other Ambulatory Visit (HOSPITAL_BASED_OUTPATIENT_CLINIC_OR_DEPARTMENT_OTHER): Payer: Self-pay

## 2023-03-08 ENCOUNTER — Other Ambulatory Visit: Payer: Self-pay | Admitting: Interventional Cardiology

## 2023-03-08 ENCOUNTER — Other Ambulatory Visit (HOSPITAL_BASED_OUTPATIENT_CLINIC_OR_DEPARTMENT_OTHER): Payer: Self-pay

## 2023-03-08 MED ORDER — ROSUVASTATIN CALCIUM 40 MG PO TABS
40.0000 mg | ORAL_TABLET | Freq: Every day | ORAL | 0 refills | Status: DC
  Filled 2023-03-08: qty 30, 30d supply, fill #0

## 2023-03-15 ENCOUNTER — Other Ambulatory Visit (HOSPITAL_BASED_OUTPATIENT_CLINIC_OR_DEPARTMENT_OTHER): Payer: Self-pay

## 2023-03-20 ENCOUNTER — Ambulatory Visit: Payer: No Typology Code available for payment source | Admitting: Family Medicine

## 2023-03-25 ENCOUNTER — Other Ambulatory Visit (HOSPITAL_BASED_OUTPATIENT_CLINIC_OR_DEPARTMENT_OTHER): Payer: Self-pay

## 2023-03-27 ENCOUNTER — Ambulatory Visit: Payer: 59 | Admitting: Internal Medicine

## 2023-03-28 ENCOUNTER — Ambulatory Visit: Payer: No Typology Code available for payment source | Attending: Internal Medicine | Admitting: Internal Medicine

## 2023-03-28 ENCOUNTER — Other Ambulatory Visit (HOSPITAL_BASED_OUTPATIENT_CLINIC_OR_DEPARTMENT_OTHER): Payer: Self-pay

## 2023-03-28 VITALS — BP 189/80 | HR 54 | Ht 60.0 in | Wt 167.0 lb

## 2023-03-28 DIAGNOSIS — E861 Hypovolemia: Secondary | ICD-10-CM

## 2023-03-28 DIAGNOSIS — I10 Essential (primary) hypertension: Secondary | ICD-10-CM | POA: Diagnosis not present

## 2023-03-28 DIAGNOSIS — E782 Mixed hyperlipidemia: Secondary | ICD-10-CM

## 2023-03-28 DIAGNOSIS — I160 Hypertensive urgency: Secondary | ICD-10-CM

## 2023-03-28 DIAGNOSIS — R011 Cardiac murmur, unspecified: Secondary | ICD-10-CM

## 2023-03-28 MED ORDER — OLMESARTAN MEDOXOMIL 40 MG PO TABS
20.0000 mg | ORAL_TABLET | Freq: Two times a day (BID) | ORAL | 3 refills | Status: DC
  Filled 2023-03-28: qty 90, 90d supply, fill #0
  Filled 2023-06-20: qty 90, 90d supply, fill #1
  Filled 2023-09-18: qty 90, 90d supply, fill #2
  Filled 2023-12-15: qty 90, 90d supply, fill #3

## 2023-03-28 MED ORDER — CARVEDILOL 12.5 MG PO TABS
12.5000 mg | ORAL_TABLET | Freq: Two times a day (BID) | ORAL | 3 refills | Status: DC
  Filled 2023-03-28: qty 180, 90d supply, fill #0

## 2023-03-28 NOTE — Patient Instructions (Signed)
Medication Instructions:  Your physician has recommended you make the following change in your medication:  STOP: Metoprolol START: carvedilol (Coreg) 12.5 mg by mouth twice daily  *If you need a refill on your cardiac medications before your next appointment, please call your pharmacy*   Lab Work: NONE  If you have labs (blood work) drawn today and your tests are completely normal, you will receive your results only by: MyChart Message (if you have MyChart) OR A paper copy in the mail If you have any lab test that is abnormal or we need to change your treatment, we will call you to review the results.   Testing/Procedures: Your physician has requested that you have an echocardiogram. Echocardiography is a painless test that uses sound waves to create images of your heart. It provides your doctor with information about the size and shape of your heart and how well your heart's chambers and valves are working. This procedure takes approximately one hour. There are no restrictions for this procedure. Please do NOT wear cologne, perfume, aftershave, or lotions (deodorant is allowed). Please arrive 15 minutes prior to your appointment time.  Please note: We ask at that you not bring children with you during ultrasound (echo/ vascular) testing. Due to room size and safety concerns, children are not allowed in the ultrasound rooms during exams. Our front office staff cannot provide observation of children in our lobby area while testing is being conducted. An adult accompanying a patient to their appointment will only be allowed in the ultrasound room at the discretion of the ultrasound technician under special circumstances. We apologize for any inconvenience.    Follow-Up: At [facility], you and your health needs are our priority.  As part of our continuing mission to provide you with exceptional heart care, we have created designated Provider Care Teams.  These Care Teams include  your primary Cardiologist (physician) and Advanced Practice Providers (APPs -  Physician Assistants and Nurse Practitioners) who all work together to provide you with the care you need, when you need it.   Your next appointment:   6 month(s)  Provider:   Christell Constant, MD     Other Instructions Please monitor your BP

## 2023-03-28 NOTE — Progress Notes (Signed)
  Cardiology Office Note:  .    Date:  03/28/2023  ID:  Elizabeth [name], DOB 02/24/1956, MRN [id number] PCP: Sharlene Dory, DO  [facility] Providers Cardiologist:  Lance Muss, MD     CC: Transition to new cardiologist   History of Present Illness: .    Elizabeth [name] is a 68 y.o. female with a history of hypertension and hyperlipidemia, presents with a significant elevation in blood pressure compared to previous readings. The patient has been experiencing sleep disturbances and mood changes, including increased irritability, which are not typical for her. She also reports recent ear pain. The patient has had previous issues with certain antihypertensive medications, specifically diuretics, which have previously caused severe hyponatremia requiring hospitalization. The patient's heart rate is noted to be slower than usual, attributed to her current medication, metoprolol. No changes in medication regimen have been made recently. The patient denies any respiratory issues or episodes of syncope. She has not needed to use her as-needed diuretic, furosemide, recently for any swelling. On examination, a heart murmur was noted.  Relevant histories: .  Social- from [place], Married, VS pt ROS: As per HPI.   Physical Exam:    VS:  BP (!) 189/80 (BP Location: Left Arm)   Pulse (!) 54   Ht 5' (1.524 m)   Wt 167 lb (75.8 kg)   SpO2 96%   BMI 32.61 kg/m    Wt Readings from Last 3 Encounters:  03/28/23 167 lb (75.8 kg)  12/24/22 170 lb 6.4 oz (77.3 kg)  11/05/22 169 lb 2 oz (76.7 kg)    Gen: no distress, obese   Neck: No JVD Cardiac: No Rubs or Gallops, holosystolic murmur, bradycardia +2 radial pulses Respiratory: Clear to auscultation bilaterally, normal effort, normal  respiratory rate GI: Soft, nontender, non-distended  MS: No  edema;  moves all extremities Integument: Skin feels warm Neuro:  At time of evaluation, alert and oriented to  person/place/time/situation  Psych: Normal affect, patient feels well   ASSESSMENT AND PLAN: .    Hypertensive Urgency Blood pressure at 189/80 mmHg. Hypertension and hyperlipidemia. No ischemic changes on EKG. Heart rate reduced due to metoprolol. Previous severe hyponatremia from diuretics. Symptoms include mood changes and insomnia, possibly related to hypertension. Discussed risks of hypertension (stroke, heart attack) and benefits of control (improved mood, sleep). Plan to increase olmesartan to a full pill to lower blood pressure while monitoring kidney function. - we clarified that she takes 40 mg total dose - Monitor blood pressure at home - switch to coreg 12.5 mg PO BID - Follow up with app or PharmD if blood pressure remains elevated  Heart Murmur Detected during physical examination. Requires further evaluation to determine significance. Discussed potential outcomes (benign murmur, underlying heart condition). Echocardiogram to assess heart structure and function. - Order echocardiogram - may increase to 25 mg PO BID  Hyponatremia Previous severe hyponatremia requiring hospitalization. Avoidance of diuretics due to this history. Monitoring kidney function to prevent recurrence.  Has not needed PRN lasix  General Health Maintenance Cholesterol levels checked in August were within normal limits.  Follow-up - Schedule follow-up visit in six months if blood pressure is controlled - Follow up sooner if echocardiogram or blood pressure results are concerning.   Elizabeth Lam, MD FASE [facility] Cardiologist [facility]  [address] [place], [address] [place], [place] [address] [phone]  11:15 AM

## 2023-03-29 ENCOUNTER — Telehealth: Payer: Self-pay | Admitting: Internal Medicine

## 2023-03-29 ENCOUNTER — Other Ambulatory Visit (HOSPITAL_BASED_OUTPATIENT_CLINIC_OR_DEPARTMENT_OTHER): Payer: Self-pay

## 2023-03-29 NOTE — Telephone Encounter (Signed)
Returned call to husband. Patient is not with him. He is going to call her and then call us back.

## 2023-03-29 NOTE — Telephone Encounter (Signed)
Pt c/o BP issue: STAT if pt c/o blurred vision, one-sided weakness or slurred speech  1. What are your last 5 BP readings? 222/95, 199/96  2. Are you having any other symptoms (ex. Dizziness, headache, blurred vision, passed out)? headache  3. What is your BP issue? Spouse said we told him to call if her BP was elevated

## 2023-03-31 ENCOUNTER — Encounter: Payer: Self-pay | Admitting: Family Medicine

## 2023-04-01 ENCOUNTER — Encounter: Payer: Self-pay | Admitting: Family Medicine

## 2023-04-01 ENCOUNTER — Other Ambulatory Visit (HOSPITAL_BASED_OUTPATIENT_CLINIC_OR_DEPARTMENT_OTHER): Payer: Self-pay

## 2023-04-01 ENCOUNTER — Ambulatory Visit: Payer: No Typology Code available for payment source | Admitting: Family Medicine

## 2023-04-01 VITALS — BP 147/72 | HR 52 | Temp 98.2°F | Ht 60.0 in | Wt 169.0 lb

## 2023-04-01 DIAGNOSIS — I10 Essential (primary) hypertension: Secondary | ICD-10-CM | POA: Diagnosis not present

## 2023-04-01 MED ORDER — HYDROCHLOROTHIAZIDE 25 MG PO TABS
25.0000 mg | ORAL_TABLET | Freq: Every day | ORAL | 3 refills | Status: DC
  Filled 2023-04-01: qty 30, 30d supply, fill #0

## 2023-04-01 NOTE — Progress Notes (Signed)
Chief Complaint  Patient presents with   Hypertension    Major concern per husband- could it be a sodium problem? There is a chart note about HTN    Subjective Pacience Elizabeth [name] is a 68 y.o. female who presents for hypertension follow up.  Here with spouse who helps with the history. She does monitor home blood pressures. Blood pressures ranging from 180-200's/80-90's on average. She is compliant with medications -Norvasc 5 mg twice daily, olmesartan 40 mg daily, carvedilol 12.5 mg twice daily. Patient has these side effects of medication: none She is adhering to a healthy diet overall. Current exercise: None No CP or SOB.    Past Medical History:  Diagnosis Date   Diabetes mellitus type 2 in obese    Hypertension     Exam BP (!) 147/72   Pulse (!) 52   Temp 98.2 F (36.8 C) (Oral)   Ht 5' (1.524 m)   Wt 169 lb (76.7 kg)   SpO2 97%   BMI 33.01 kg/m  General:  well developed, well nourished, in no apparent distress Heart: RRR, no bruits, no LE edema Lungs: clear to auscultation, no accessory muscle use Psych: well oriented with normal range of affect and appropriate judgment/insight  Essential hypertension, benign - Plan: hydrochlorothiazide (HYDRODIURIL) 25 MG tablet, Basic metabolic panel  Chronic, uncontrolled.  Start hydrochlorothiazide 25 mg daily.  Labs today and in 1 week.  Continue Norvasc 5 mg twice daily, Coreg 12.5 mg twice daily, olmesartan 40 mg daily.  Monitor blood pressure at home.  Counseled on diet and exercise. F/u in 1 week for labs/BMP, send message in 2 weeks w BP readings, f/u in 1 mo w me. The patient voiced understanding and agreement to the plan.  Jilda Roche [place], DO 04/01/23  4:27 PM

## 2023-04-01 NOTE — Patient Instructions (Addendum)
Give [place] 2-3 business days to get the results of your labs back.   Keep the diet clean and stay active.  Check your blood pressures 2-3 times per week, alternating the time of day you check it. If it is high, considering waiting 1-2 minutes and rechecking. If it gets higher, your anxiety is likely creeping up and we should avoid rechecking.   Send me some readings in 2 weeks of blood pressure.   Let us know if you need anything.

## 2023-04-02 ENCOUNTER — Encounter: Payer: Self-pay | Admitting: Family Medicine

## 2023-04-02 LAB — BASIC METABOLIC PANEL
BUN: 15 mg/dL (ref 6–23)
CO2: 27 meq/L (ref 19–32)
Calcium: 9.6 mg/dL (ref 8.4–10.5)
Chloride: 101 meq/L (ref 96–112)
Creatinine, Ser: 0.94 mg/dL (ref 0.40–1.20)
GFR: 63.06 mL/min (ref >60.00)
Glucose, Bld: 120 mg/dL — ABNORMAL HIGH (ref 70–99)
Potassium: 4.3 meq/L (ref 3.5–5.1)
Sodium: 137 meq/L (ref 135–145)

## 2023-04-05 NOTE — Telephone Encounter (Signed)
Pt was seen on 04/01/2023 by PCP for HTN evaluation and management.  See PCP note below.  BP (!) 147/72   Pulse (!) 52   Temp 98.2 F (36.8 C) (Oral)   Ht 5' (1.524 m)   Wt 169 lb (76.7 kg)   SpO2 97%   BMI 33.01 kg/m  General:  well developed, well nourished, in no apparent distress Heart: RRR, no bruits, no LE edema Lungs: clear to auscultation, no accessory muscle use Psych: well oriented with normal range of affect and appropriate judgment/insight   Essential hypertension, benign - Plan: hydrochlorothiazide (HYDRODIURIL) 25 MG tablet, Basic metabolic panel   Chronic, uncontrolled.  Start hydrochlorothiazide 25 mg daily.  Labs today and in 1 week.  Continue Norvasc 5 mg twice daily, Coreg 12.5 mg twice daily, olmesartan 40 mg daily.  Monitor blood pressure at home.  Counseled on diet and exercise. F/u in 1 week for labs/BMP, send message in 2 weeks w BP readings, f/u in 1 mo w me. The patient voiced understanding and agreement to the plan.   Jilda Roche [place], DO 04/01/23  4:27 PM

## 2023-04-08 ENCOUNTER — Other Ambulatory Visit: Payer: Self-pay | Admitting: Interventional Cardiology

## 2023-04-09 ENCOUNTER — Other Ambulatory Visit (HOSPITAL_BASED_OUTPATIENT_CLINIC_OR_DEPARTMENT_OTHER): Payer: Self-pay

## 2023-04-09 MED ORDER — ROSUVASTATIN CALCIUM 40 MG PO TABS
40.0000 mg | ORAL_TABLET | Freq: Every day | ORAL | 3 refills | Status: DC
  Filled 2023-04-09 – 2023-08-02 (×3): qty 90, 90d supply, fill #0
  Filled 2024-01-20: qty 90, 90d supply, fill #1

## 2023-04-10 ENCOUNTER — Other Ambulatory Visit (HOSPITAL_BASED_OUTPATIENT_CLINIC_OR_DEPARTMENT_OTHER): Payer: Self-pay

## 2023-04-11 ENCOUNTER — Other Ambulatory Visit (HOSPITAL_BASED_OUTPATIENT_CLINIC_OR_DEPARTMENT_OTHER): Payer: Self-pay

## 2023-04-12 ENCOUNTER — Other Ambulatory Visit (HOSPITAL_BASED_OUTPATIENT_CLINIC_OR_DEPARTMENT_OTHER): Payer: Self-pay

## 2023-04-16 ENCOUNTER — Ambulatory Visit (HOSPITAL_COMMUNITY): Payer: No Typology Code available for payment source | Attending: Internal Medicine

## 2023-04-16 DIAGNOSIS — I160 Hypertensive urgency: Secondary | ICD-10-CM | POA: Diagnosis present

## 2023-04-16 DIAGNOSIS — R011 Cardiac murmur, unspecified: Secondary | ICD-10-CM | POA: Insufficient documentation

## 2023-04-16 DIAGNOSIS — E782 Mixed hyperlipidemia: Secondary | ICD-10-CM | POA: Diagnosis present

## 2023-04-16 DIAGNOSIS — I10 Essential (primary) hypertension: Secondary | ICD-10-CM | POA: Insufficient documentation

## 2023-04-16 LAB — ECHOCARDIOGRAM COMPLETE
Area-P 1/2: 4.06 cm2
S' Lateral: 2.8 cm

## 2023-04-17 ENCOUNTER — Encounter: Payer: Self-pay | Admitting: Internal Medicine

## 2023-04-18 ENCOUNTER — Other Ambulatory Visit (HOSPITAL_BASED_OUTPATIENT_CLINIC_OR_DEPARTMENT_OTHER): Payer: Self-pay

## 2023-04-18 ENCOUNTER — Telehealth: Payer: Self-pay

## 2023-04-18 ENCOUNTER — Encounter: Payer: Self-pay | Admitting: Internal Medicine

## 2023-04-18 DIAGNOSIS — I160 Hypertensive urgency: Secondary | ICD-10-CM

## 2023-04-18 MED ORDER — CHLORTHALIDONE 25 MG PO TABS
25.0000 mg | ORAL_TABLET | Freq: Every day | ORAL | 3 refills | Status: AC
  Filled 2023-04-18: qty 90, 90d supply, fill #0
  Filled 2023-07-10: qty 90, 90d supply, fill #1

## 2023-04-18 NOTE — Telephone Encounter (Signed)
-----   Message from Stanly DELENA Leavens sent at 04/17/2023  1:52 PM EST ----- Results: Mild to moderate TR, Elevated PASP Hypertensive urgency Plan: Switch from hydrochlorothiazide  to chlorthalidone  25 mg PO daily and BMP in 1-2 weeks  Stanly DELENA Leavens, MD

## 2023-04-18 NOTE — Telephone Encounter (Signed)
 The patient spouse has been notified of the result and verbalized understanding.  All questions (if any) were answered. Hamp LOISE Norrie, RN 04/18/2023 11:12 AM   Spouse reports insurance does not allow lab draw with [employer] pt has to use [facility].  Placed BMP order for [employer] out and mailed.  Advised spouse may not receive orders prior to date lab needed. He expresses understanding.

## 2023-05-08 ENCOUNTER — Encounter: Payer: Self-pay | Admitting: Internal Medicine

## 2023-05-08 NOTE — Telephone Encounter (Signed)
 Called pt spouse in regards to message.  Spouse reports BP today 191/101 2 hours after taking morning BP meds.  Spouse is concerned that pt is not sleeping at night.  Expresses MD mentioned increasing carvedilol at last OV.  Denies HA, blurry vision.  Denies increased salt intake.  Reports takes all BP meds as ordered.   Advised spouse pt has not had f/u labs drawn d/t medication change made by MD.  Algis Downs pt needs to have labs drawn ASAP.  Asked that spouse send in BP log for MD to review.  Advised spouse of ED precautions.  Will send to MD to advise.

## 2023-05-10 ENCOUNTER — Encounter: Payer: Self-pay | Admitting: Internal Medicine

## 2023-05-10 ENCOUNTER — Other Ambulatory Visit (HOSPITAL_BASED_OUTPATIENT_CLINIC_OR_DEPARTMENT_OTHER): Payer: Self-pay

## 2023-05-10 LAB — BASIC METABOLIC PANEL
BUN: 16 mg/dL (ref 7–25)
CO2: 30 mmol/L (ref 20–32)
Calcium: 9.7 mg/dL (ref 8.6–10.4)
Chloride: 97 mmol/L — ABNORMAL LOW (ref 98–110)
Creat: 0.86 mg/dL (ref 0.50–1.05)
Glucose, Bld: 138 mg/dL — ABNORMAL HIGH (ref 65–99)
Potassium: 4.4 mmol/L (ref 3.5–5.3)
Sodium: 134 mmol/L — ABNORMAL LOW (ref 135–146)

## 2023-05-10 MED ORDER — SPIRONOLACTONE 25 MG PO TABS
12.5000 mg | ORAL_TABLET | Freq: Every day | ORAL | 3 refills | Status: AC
Start: 2023-05-10 — End: 2023-11-14
  Filled 2023-05-10: qty 45, 90d supply, fill #0
  Filled 2023-08-05 – 2023-08-16 (×3): qty 45, 90d supply, fill #1

## 2023-05-10 NOTE — Addendum Note (Signed)
 Addended by: Jeannette How A on: 05/10/2023 04:10 PM   Modules accepted: Orders

## 2023-05-27 ENCOUNTER — Ambulatory Visit: Attending: Cardiovascular Disease | Admitting: Pharmacist

## 2023-05-27 ENCOUNTER — Other Ambulatory Visit (HOSPITAL_BASED_OUTPATIENT_CLINIC_OR_DEPARTMENT_OTHER): Payer: Self-pay

## 2023-05-27 ENCOUNTER — Telehealth: Payer: Self-pay | Admitting: Pharmacist

## 2023-05-27 VITALS — BP 152/64 | HR 52

## 2023-05-27 DIAGNOSIS — E1165 Type 2 diabetes mellitus with hyperglycemia: Secondary | ICD-10-CM | POA: Diagnosis not present

## 2023-05-27 DIAGNOSIS — I10 Essential (primary) hypertension: Secondary | ICD-10-CM | POA: Diagnosis not present

## 2023-05-27 MED ORDER — CARVEDILOL 25 MG PO TABS
25.0000 mg | ORAL_TABLET | Freq: Two times a day (BID) | ORAL | 3 refills | Status: AC
  Filled 2023-05-27: qty 180, 90d supply, fill #0
  Filled 2023-08-02 – 2023-08-12 (×2): qty 180, 90d supply, fill #1
  Filled 2023-11-18: qty 180, 90d supply, fill #2
  Filled 2024-02-17: qty 180, 90d supply, fill #3

## 2023-05-27 NOTE — Assessment & Plan Note (Signed)
 Assessment: BP is uncontrolled in office BP 152/ 64 mmHg heart rate 52 (goal <130/80) Takes current BP meds regularly and tolerates them well without any side effects Denies SOB, palpitation, chest pain, headaches,or swelling Home BP cuff never validated and does not follow correct steps for BP measurement  Reiterated the importance of regular exercise and low salt diet   Plan:  Increase dose of carvedilol from 12.5 mg twice daily to 25 mg twice daily  Continue taking amlodipine 5 mg daily, chlorthalidone 25 mg daily, Olmesartan 40 mg daily , spironolactone 12.5 mg daily , guanfacine 2 mg at bedtime  Patient to keep record of BP readings with heart rate and report to [place] at the next visit Patient to see PharmD in 8 weeks for follow up  Follow up lab(s) : BMP tomorrow to assess electrolytes and renal function post MRA initiation

## 2023-05-27 NOTE — Patient Instructions (Addendum)
 Changes made by your pharmacist Carmela Hurt, PharmD at today's visit:    Instructions/Changes  (what do you need to do) Your Notes  (what you did and when you did it)  Increase carvedilol from 12.5 twice daily to 25 mg twice daily continue taking amlodipine 5 mg daily,  chlorthalidone 25 mg daily, Olmesartan 40 mg daily , spironolactone 12.5 mg daily    Cut down on salt intake    Continue doing regular exercise     Bring all of your meds, your BP cuff and your record of home blood pressures to your next appointment.    HOW TO TAKE YOUR BLOOD PRESSURE AT HOME  Rest 5 minutes before taking your blood pressure.  Don't smoke or drink caffeinated beverages for at least 30 minutes before. Take your blood pressure before (not after) you eat. Sit comfortably with your back supported and both feet on the floor (don't cross your legs). Elevate your arm to heart level on a table or a desk. Use the proper sized cuff. It should fit smoothly and snugly around your bare upper arm. There should be enough room to slip a fingertip under the cuff. The bottom edge of the cuff should be 1 inch above the crease of the elbow. Ideally, take 3 measurements at one sitting and record the average.  Important lifestyle changes to control high blood pressure  Intervention  Effect on the BP  Lose extra pounds and watch your waistline Weight loss is one of the most effective lifestyle changes for controlling blood pressure. If you're overweight or obese, losing even a small amount of weight can help reduce blood pressure. Blood pressure might go down by about 1 millimeter of mercury (mm Hg) with each kilogram (about 2.2 pounds) of weight lost.  Exercise regularly As a general goal, aim for at least 30 minutes of moderate physical activity every day. Regular physical activity can lower high blood pressure by about 5 to 8 mm Hg.  Eat a healthy diet Eating a diet rich in whole grains, fruits, vegetables, and low-fat  dairy products and low in saturated fat and cholesterol. A healthy diet can lower high blood pressure by up to 11 mm Hg.  Reduce salt (sodium) in your diet Even a small reduction of sodium in the diet can improve heart health and reduce high blood pressure by about 5 to 6 mm Hg.  Limit alcohol One drink equals 12 ounces of beer, 5 ounces of wine, or 1.5 ounces of 80-proof liquor.  Limiting alcohol to less than one drink a day for women or two drinks a day for men can help lower blood pressure by about 4 mm Hg.   If you have any questions or concerns please use My Chart to send questions or call the office at [phone]

## 2023-05-27 NOTE — Progress Notes (Signed)
 Patient ID: Elizabeth [name]                 DOB: 1955-12-05                      MRN: [id number]      HPI: Kennidy Lamke is a 68 y.o. female referred by Dr. Izora Ribas to HTN clinic. PMH is significant for hypertension, hypertensive urgency, T2DM, Obesity, HLD.   Patient was Dr. Hoyle Barr patient. Patient has hypertension problem for long time. She has anxiety and that drives her BP up sometime. Patient saw Dr.Chandrasekhar on Jan /16 - BP was elevated and heart rate was low so metoprolol was switched to carvedilol 12.5 mg was  and olmesartan dose were split to 20 mg twice daily from 40 mg daily. Has hx of  hyponatremia  from diuretic use lead hospitalization. HCTZ was added by PCP then it was changed to chlorthalidone for better BP lowering effect and spironolactone 12.5 mg daily was added to the other BP meds.  Pt presented with her husband who was interpreter for her too. Reports her BP still remains high at home. Did not bring BP log or monitor for validation recalls some readings from memory ~150-180/80. Husband states pt get anxious when she sees elevated BP. She feels ear fullness and headache whenever her BP is high. She has chronic insomnia and chronic constipation problem.  Reports she tolerates current BP medications well. She is not following correct BP measurement steps at home. We reviewed them in detail. Reports she dose not add salt to her food. Her A1c is in diabetic range but not on any medications. She is interested in losing weight which will help for better BP control we will assess coverage for Ozempic or Mounjaro.  Current HTN meds: amlodipine 5 mg daily, carvedilol 12.5 mg twice daily, chlorthalidone 25 mg daily, Olmesartan 40 mg daily , spironolactone 12.5 mg daily , guanfacine 2 mg at bedtime  Previously tried:  BP goal: <130/80   Family History:    Relation Problem Comments  Mother (Deceased) Hypertension     Father (Deceased)   Maternal Grandmother (Deceased)    Maternal Grandfather (Deceased)   Paternal Grandmother (Deceased)   Paternal Grandfather (Deceased)     Social History:  Alcohol: none  Smoking: none  Diet: follows low salt diet, does not eat out  Exercise: 45 min per day most days of the week   Home BP readings: did not bring home BP log and doe not follow correct BP measurement steps    Wt Readings from Last 3 Encounters:  04/01/23 169 lb (76.7 kg)  03/28/23 167 lb (75.8 kg)  12/24/22 170 lb 6.4 oz (77.3 kg)   BP Readings from Last 3 Encounters:  05/27/23 (!) 152/64  04/01/23 (!) 147/72  03/28/23 (!) 189/80   Pulse Readings from Last 3 Encounters:  05/27/23 (!) 52  04/01/23 (!) 52  03/28/23 (!) 54    Renal function: CrCl cannot be calculated (Unknown ideal weight.).  Past Medical History:  Diagnosis Date   Diabetes mellitus type 2 in obese    Hypertension     Current Outpatient Medications on File Prior to Visit  Medication Sig Dispense Refill   olmesartan (BENICAR) 40 MG tablet Take 1 tablet (40 mg total) by mouth daily.     amLODipine (NORVASC) 5 MG tablet Take 1 tablet (5 mg total) by mouth 2 (two) times daily. 180 tablet 3   blood glucose meter kit  and supplies KIT daily as needed. Dispense based on patient and insurance preference. Use up to once  daily as directed.     chlorthalidone (HYGROTON) 25 MG tablet Take 1 tablet (25 mg total) by mouth daily. 90 tablet 3   fluticasone (FLONASE) 50 MCG/ACT nasal spray Place 2 sprays into both nostrils daily. 16 g 6   furosemide (LASIX) 40 MG tablet Take 1 tablet (40 mg total) by mouth daily as needed for fluid retention or swelling. 90 tablet 1   glucose blood (IGLUCOSE TEST STRIPS) test strip Dispense based on patient and insurance preference. Test once daily to check blood sugar. 100 each 12   guanFACINE (TENEX) 1 MG tablet Take 2 tablets (2 mg total) by mouth at bedtime. 60 tablet 3   Lancets MISC Dispense based on patient and insurance preference.  Test once  daily to check blood sugar.     olmesartan (BENICAR) 40 MG tablet Take 0.5 tablets (20 mg total) by mouth 2 (two) times daily. 90 tablet 3   omeprazole (PRILOSEC) 40 MG capsule Take 1 capsule (40 mg total) by mouth daily. 30 capsule 3   rosuvastatin (CRESTOR) 40 MG tablet Take 1 tablet (40 mg total) by mouth daily. 90 tablet 3   spironolactone (ALDACTONE) 25 MG tablet Take 0.5 tablets (12.5 mg total) by mouth daily. 90 tablet 3   No current facility-administered medications on file prior to visit.    No Known Allergies  Blood pressure (!) 152/64, pulse (!) 52, SpO2 99%.   Assessment/Plan:  1. Hypertension -  Essential hypertension, benign Assessment: BP is uncontrolled in office BP 152/ 64 mmHg heart rate 52 (goal <130/80) Takes current BP meds regularly and tolerates them well without any side effects Denies SOB, palpitation, chest pain, headaches,or swelling Home BP cuff never validated and does not follow correct steps for BP measurement  Reiterated the importance of regular exercise and low salt diet   Plan:  Increase dose of carvedilol from 12.5 mg twice daily to 25 mg twice daily  Continue taking amlodipine 5 mg daily, chlorthalidone 25 mg daily, Olmesartan 40 mg daily , spironolactone 12.5 mg daily , guanfacine 2 mg at bedtime  Patient to keep record of BP readings with heart rate and report to [place] at the next visit Patient to see PharmD in 8 weeks for follow up  Follow up lab(s) : BMP tomorrow to assess electrolytes and renal function post MRA initiation   Type 2 diabetes mellitus with hyperglycemia, without long-term current use of insulin (HCC) The ADA recommends GLP-1 receptor agonists (GLP-1 RAs) for people with type 2 diabetes and established or high risk of atherosclerotic cardiovascular disease (ASCVD), heart failure, or chronic kidney disease (CKD), regardless of baseline HbA1c. Given elevated BP patient would benefit  weight loss from GLP1 treatment . Will assess  coverage for Ozempic or Mounjaro     Thank you  Carmela Hurt, Pharm.D [facility] A Division of  [facility] [address] [place], [place] [address]  Phone: [phone]; Fax: [phone]

## 2023-05-27 NOTE — Assessment & Plan Note (Signed)
 The ADA recommends GLP-1 receptor agonists (GLP-1 RAs) for people with type 2 diabetes and established or high risk of atherosclerotic cardiovascular disease (ASCVD), heart failure, or chronic kidney disease (CKD), regardless of baseline HbA1c. Given elevated BP patient would benefit  weight loss from GLP1 treatment . Will assess coverage for Ozempic or [facility]

## 2023-05-28 ENCOUNTER — Other Ambulatory Visit (HOSPITAL_COMMUNITY): Payer: Self-pay

## 2023-05-28 ENCOUNTER — Telehealth: Payer: Self-pay | Admitting: Pharmacy Technician

## 2023-05-28 NOTE — Telephone Encounter (Signed)
 Pharmacy Patient Advocate Encounter  Received notification from [facility] that Prior Authorization for Elizabeth [name] has been DENIED.  Full denial letter will be uploaded to the media tab. See denial reason below.   PA #/Case ID/Reference #: [id number]

## 2023-05-28 NOTE — Telephone Encounter (Signed)
 Pharmacy Patient Advocate Encounter   Received notification from Pt Calls Messages that prior authorization for mounjaro is required/requested.   Insurance verification completed.   The patient is insured through [facility] .   Per test claim: PA required; PA submitted to above mentioned insurance via CoverMyMeds Key/confirmation #/EOC [id number] Status is pending

## 2023-05-29 NOTE — Telephone Encounter (Signed)
 PA denied see other encounter.

## 2023-06-04 ENCOUNTER — Other Ambulatory Visit (HOSPITAL_BASED_OUTPATIENT_CLINIC_OR_DEPARTMENT_OTHER): Payer: Self-pay

## 2023-06-04 ENCOUNTER — Other Ambulatory Visit: Payer: Self-pay | Admitting: Family Medicine

## 2023-06-04 MED ORDER — GUANFACINE HCL 2 MG PO TABS
2.0000 mg | ORAL_TABLET | Freq: Every day | ORAL | 3 refills | Status: DC
  Filled 2023-06-04: qty 30, 30d supply, fill #0
  Filled 2023-08-02: qty 30, 30d supply, fill #1
  Filled 2023-09-06: qty 30, 30d supply, fill #2
  Filled 2023-10-06: qty 30, 30d supply, fill #3

## 2023-07-02 ENCOUNTER — Encounter: Payer: Self-pay | Admitting: Family Medicine

## 2023-07-03 ENCOUNTER — Other Ambulatory Visit: Payer: Self-pay

## 2023-07-03 ENCOUNTER — Other Ambulatory Visit (HOSPITAL_BASED_OUTPATIENT_CLINIC_OR_DEPARTMENT_OTHER): Payer: Self-pay

## 2023-07-03 MED ORDER — OMEPRAZOLE 40 MG PO CPDR
40.0000 mg | DELAYED_RELEASE_CAPSULE | Freq: Every day | ORAL | 3 refills | Status: DC
  Filled 2023-07-03: qty 30, 30d supply, fill #0
  Filled 2023-08-02: qty 30, 30d supply, fill #1
  Filled 2023-09-10: qty 30, 30d supply, fill #2

## 2023-07-11 ENCOUNTER — Other Ambulatory Visit (HOSPITAL_BASED_OUTPATIENT_CLINIC_OR_DEPARTMENT_OTHER): Payer: Self-pay

## 2023-07-22 ENCOUNTER — Encounter: Payer: Self-pay | Admitting: Internal Medicine

## 2023-07-22 NOTE — Progress Notes (Deleted)
 Patient ID: Elizabeth [name]                 DOB: 1955/10/16                      MRN: [id number]      HPI: Elizabeth [name] is a 68 y.o. female referred by Elizabeth [name] to HTN clinic. PMH is significant for hypertension, hypertensive urgency, T2DM, Obesity, HLD.   Patient was Elizabeth [name] patient. Patient has hypertension problem for long time. She has anxiety and that drives her BP up sometime. Patient saw Elizabeth [name] on Jan /16 - BP was elevated and heart rate was low so metoprolol  was switched to carvedilol  12.5 mg was  and olmesartan  dose were split to 20 mg twice daily from 40 mg daily. Has hx of  hyponatremia  from diuretic use lead hospitalization. HCTZ was added by PCP then it was changed to chlorthalidone  for better BP lowering effect and spironolactone  12.5 mg daily was added to the other BP meds.  Pt presented with her husband who was interpreter for her too. Reports her BP still remains high at home. Did not bring BP log or monitor for validation recalls some readings from memory ~150-180/80. Husband states pt get anxious when she sees elevated BP. She feels ear fullness and headache whenever her BP is high. She has chronic insomnia and chronic constipation problem.  Reports she tolerates current BP medications well. She is not following correct BP measurement steps at home. We reviewed them in detail. Reports she dose not add salt to her food. Her A1c is in diabetic range but not on any medications. She is interested in losing weight which will help for better BP control we will assess coverage for Ozempic or Mounjaro.  Current HTN meds: amlodipine  5 mg daily, carvedilol  12.5 mg twice daily, chlorthalidone  25 mg daily, Olmesartan  40 mg daily , spironolactone  12.5 mg daily , guanfacine  2 mg at bedtime  Previously tried:  BP goal: <130/80   Family History:    Relation Problem Comments  Mother (Deceased) Hypertension     Father (Deceased)   Maternal Grandmother (Deceased)    Maternal Grandfather (Deceased)   Paternal Grandmother (Deceased)   Paternal Grandfather (Deceased)     Social History:  Alcohol: none  Smoking: none  Diet: follows low salt diet, does not eat out  Exercise: 45 min per day most days of the week   Home BP readings: did not bring home BP log and doe not follow correct BP measurement steps    Wt Readings from Last 3 Encounters:  04/01/23 169 lb (76.7 kg)  03/28/23 167 lb (75.8 kg)  12/24/22 170 lb 6.4 oz (77.3 kg)   BP Readings from Last 3 Encounters:  05/27/23 (!) 152/64  04/01/23 (!) 147/72  03/28/23 (!) 189/80   Pulse Readings from Last 3 Encounters:  05/27/23 (!) 52  04/01/23 (!) 52  03/28/23 (!) 54    Renal function: CrCl cannot be calculated (Patient's most recent lab result is older than the maximum 21 days allowed.).  Past Medical History:  Diagnosis Date   Diabetes mellitus type 2 in obese    Hypertension     Current Outpatient Medications on File Prior to Visit  Medication Sig Dispense Refill   amLODipine  (NORVASC ) 5 MG tablet Take 1 tablet (5 mg total) by mouth 2 (two) times daily. 180 tablet 3   blood glucose meter kit and supplies KIT daily as needed. Dispense based  on patient and insurance preference. Use up to once  daily as directed.     carvedilol  (COREG ) 25 MG tablet Take 1 tablet (25 mg total) by mouth 2 (two) times daily. 180 tablet 3   chlorthalidone  (HYGROTON ) 25 MG tablet Take 1 tablet (25 mg total) by mouth daily. 90 tablet 3   fluticasone  (FLONASE ) 50 MCG/ACT nasal spray Place 2 sprays into both nostrils daily. 16 g 6   furosemide  (LASIX ) 40 MG tablet Take 1 tablet (40 mg total) by mouth daily as needed for fluid retention or swelling. 90 tablet 1   glucose blood (IGLUCOSE TEST STRIPS) test strip Dispense based on patient and insurance preference. Test once daily to check blood sugar. 100 each 12   guanFACINE  (TENEX ) 2 MG tablet Take 1 tablet (2 mg total) by mouth at bedtime. 30 tablet 3    Lancets MISC Dispense based on patient and insurance preference.  Test once daily to check blood sugar.     olmesartan  (BENICAR ) 40 MG tablet Take 0.5 tablets (20 mg total) by mouth 2 (two) times daily. 90 tablet 3   olmesartan  (BENICAR ) 40 MG tablet Take 1 tablet (40 mg total) by mouth daily.     omeprazole  (PRILOSEC) 40 MG capsule Take 1 capsule (40 mg total) by mouth daily. 30 capsule 3   rosuvastatin  (CRESTOR ) 40 MG tablet Take 1 tablet (40 mg total) by mouth daily. 90 tablet 3   spironolactone  (ALDACTONE ) 25 MG tablet Take 0.5 tablets (12.5 mg total) by mouth daily. 90 tablet 3   No current facility-administered medications on file prior to visit.    No Known Allergies  There were no vitals taken for this visit.   Assessment/Plan:  1. Hypertension -  No problem-specific Assessment & Plan notes found for this encounter.     Thank you  Elizabeth [name], Pharm.D [facility] [address] [place], [place] [address]  Phone: [phone]; Fax: [phone]

## 2023-07-24 ENCOUNTER — Ambulatory Visit: Admitting: Pharmacist

## 2023-07-26 ENCOUNTER — Ambulatory Visit: Admitting: Family Medicine

## 2023-07-26 ENCOUNTER — Ambulatory Visit: Payer: Self-pay | Admitting: Family Medicine

## 2023-07-26 ENCOUNTER — Telehealth: Payer: Self-pay

## 2023-07-26 ENCOUNTER — Other Ambulatory Visit (HOSPITAL_BASED_OUTPATIENT_CLINIC_OR_DEPARTMENT_OTHER): Payer: Self-pay

## 2023-07-26 ENCOUNTER — Encounter: Payer: Self-pay | Admitting: Family Medicine

## 2023-07-26 VITALS — BP 130/88 | HR 76 | Temp 98.1°F | Ht 61.0 in | Wt 160.0 lb

## 2023-07-26 DIAGNOSIS — Z7984 Long term (current) use of oral hypoglycemic drugs: Secondary | ICD-10-CM | POA: Diagnosis not present

## 2023-07-26 DIAGNOSIS — E1165 Type 2 diabetes mellitus with hyperglycemia: Secondary | ICD-10-CM

## 2023-07-26 DIAGNOSIS — I10 Essential (primary) hypertension: Secondary | ICD-10-CM | POA: Diagnosis not present

## 2023-07-26 DIAGNOSIS — M545 Low back pain, unspecified: Secondary | ICD-10-CM | POA: Diagnosis not present

## 2023-07-26 LAB — MICROALBUMIN / CREATININE URINE RATIO
Creatinine,U: 119.6 mg/dL
Microalb Creat Ratio: UNDETERMINED mg/g (ref 0.0–30.0)
Microalb, Ur: <0.7 mg/dL

## 2023-07-26 LAB — COMPREHENSIVE METABOLIC PANEL WITH GFR
ALT: 14 U/L (ref 0–35)
AST: 15 U/L (ref 0–37)
Albumin: 4.1 g/dL (ref 3.5–5.2)
Alkaline Phosphatase: 60 U/L (ref 39–117)
BUN: 13 mg/dL (ref 6–23)
CO2: 30 meq/L (ref 19–32)
Calcium: 9.2 mg/dL (ref 8.4–10.5)
Chloride: 107 meq/L (ref 96–112)
Creatinine, Ser: 0.89 mg/dL (ref 0.40–1.20)
GFR: 67.19 mL/min (ref >60.00)
Glucose, Bld: 137 mg/dL — ABNORMAL HIGH (ref 70–99)
Potassium: 4.7 meq/L (ref 3.5–5.1)
Sodium: 143 meq/L (ref 135–145)
Total Bilirubin: 0.5 mg/dL (ref 0.2–1.2)
Total Protein: 6.7 g/dL (ref 6.0–8.3)

## 2023-07-26 LAB — LIPID PANEL
Cholesterol: 195 mg/dL (ref 0–200)
HDL: 55.9 mg/dL (ref >39.00)
LDL Cholesterol: 125 mg/dL — ABNORMAL HIGH (ref 0–99)
NonHDL: 139.56
Total CHOL/HDL Ratio: 3
Triglycerides: 73 mg/dL (ref 0.0–149.0)
VLDL: 14.6 mg/dL (ref 0.0–40.0)

## 2023-07-26 LAB — HEMOGLOBIN A1C: Hgb A1c MFr Bld: 6.8 % — ABNORMAL HIGH (ref 4.6–6.5)

## 2023-07-26 MED ORDER — METHOCARBAMOL 500 MG PO TABS
500.0000 mg | ORAL_TABLET | Freq: Three times a day (TID) | ORAL | 0 refills | Status: DC | PRN
  Filled 2023-07-26: qty 20, 7d supply, fill #0

## 2023-07-26 MED ORDER — RYBELSUS 7 MG PO TABS
7.0000 mg | ORAL_TABLET | Freq: Every day | ORAL | 2 refills | Status: AC
  Filled 2023-07-26 – 2024-01-20 (×9): qty 30, 30d supply, fill #0

## 2023-07-26 MED ORDER — RYBELSUS 3 MG PO TABS
3.0000 mg | ORAL_TABLET | Freq: Every day | ORAL | 0 refills | Status: AC
  Filled 2023-07-26 – 2023-08-03 (×4): qty 30, 30d supply, fill #0

## 2023-07-26 MED ORDER — METHYLPREDNISOLONE ACETATE 80 MG/ML IJ SUSP
80.0000 mg | Freq: Once | INTRAMUSCULAR | Status: AC
  Administered 2023-07-26: 80 mg via INTRAMUSCULAR

## 2023-07-26 NOTE — Patient Instructions (Signed)
 Give us  2-3 business days to get the results of your labs back.   Keep the diet clean and stay active.  Let us  know if you need anything.  If you do not hear anything about your referral in the next 1-2 weeks, call our office and ask for an update.  EXERCISES  RANGE OF MOTION (ROM) AND STRETCHING EXERCISES - Low Back Pain Most people with lower back pain will find that their symptoms get worse with excessive bending forward (flexion) or arching at the lower back (extension). The exercises that will help resolve your symptoms will focus on the opposite motion.  If you have pain, numbness or tingling which travels down into your buttocks, leg or foot, the goal of the therapy is for these symptoms to move closer to your back and eventually resolve. Sometimes, these leg symptoms will get better, but your lower back pain may worsen. This is often an indication of progress in your rehabilitation. Be very alert to any changes in your symptoms and the activities in which you participated in the 24 hours prior to the change. Sharing this information with your caregiver will allow him or her to most efficiently treat your condition. These exercises may help you when beginning to rehabilitate your injury. Your symptoms may resolve with or without further involvement from your physician, physical therapist or athletic trainer. While completing these exercises, remember:  Restoring tissue flexibility helps normal motion to return to the joints. This allows healthier, less painful movement and activity. An effective stretch should be held for at least 30 seconds. A stretch should never be painful. You should only feel a gentle lengthening or release in the stretched tissue. FLEXION RANGE OF MOTION AND STRETCHING EXERCISES:  STRETCH - Flexion, Single Knee to Chest  Lie on a firm bed or floor with both legs extended in front of you. Keeping one leg in contact with the floor, bring your opposite knee to your  chest. Hold your leg in place by either grabbing behind your thigh or at your knee. Pull until you feel a gentle stretch in your low back. Hold 30 seconds. Slowly release your grasp and repeat the exercise with the opposite side. Repeat 2 times. Complete this exercise 3 times per week.   STRETCH - Flexion, Double Knee to Chest Lie on a firm bed or floor with both legs extended in front of you. Keeping one leg in contact with the floor, bring your opposite knee to your chest. Tense your stomach muscles to support your back and then lift your other knee to your chest. Hold your legs in place by either grabbing behind your thighs or at your knees. Pull both knees toward your chest until you feel a gentle stretch in your low back. Hold 30 seconds. Tense your stomach muscles and slowly return one leg at a time to the floor. Repeat 2 times. Complete this exercise 3 times per week.   STRETCH - Low Trunk Rotation Lie on a firm bed or floor. Keeping your legs in front of you, bend your knees so they are both pointed toward the ceiling and your feet are flat on the floor. Extend your arms out to the side. This will stabilize your upper body by keeping your shoulders in contact with the floor. Gently and slowly drop both knees together to one side until you feel a gentle stretch in your low back. Hold for 30 seconds. Tense your stomach muscles to support your lower back as you bring  your knees back to the starting position. Repeat the exercise to the other side. Repeat 2 times. Complete this exercise at least 3 times per week.   EXTENSION RANGE OF MOTION AND FLEXIBILITY EXERCISES:  STRETCH - Extension, Prone on Elbows  Lie on your stomach on the floor, a bed will be too soft. Place your palms about shoulder width apart and at the height of your head. Place your elbows under your shoulders. If this is too painful, stack pillows under your chest. Allow your body to relax so that your hips drop lower and  make contact more completely with the floor. Hold this position for 30 seconds. Slowly return to lying flat on the floor. Repeat 2 times. Complete this exercise 3 times per week.   RANGE OF MOTION - Extension, Prone Press Ups Lie on your stomach on the floor, a bed will be too soft. Place your palms about shoulder width apart and at the height of your head. Keeping your back as relaxed as possible, slowly straighten your elbows while keeping your hips on the floor. You may adjust the placement of your hands to maximize your comfort. As you gain motion, your hands will come more underneath your shoulders. Hold this position 30 seconds. Slowly return to lying flat on the floor. Repeat 2 times. Complete this exercise 3 times per week.   RANGE OF MOTION- Quadruped, Neutral Spine  Assume a hands and knees position on a firm surface. Keep your hands under your shoulders and your knees under your hips. You may place padding under your knees for comfort. Drop your head and point your tailbone toward the ground below you. This will round out your lower back like an angry cat. Hold this position for 30 seconds. Slowly lift your head and release your tail bone so that your back sags into a large arch, like an old horse. Hold this position for 30 seconds. Repeat this until you feel limber in your low back. Now, find your "sweet spot." This will be the most comfortable position somewhere between the two previous positions. This is your neutral spine. Once you have found this position, tense your stomach muscles to support your low back. Hold this position for 30 seconds. Repeat 2 times. Complete this exercise 3 times per week.   STRENGTHENING EXERCISES - Low Back Sprain These exercises may help you when beginning to rehabilitate your injury. These exercises should be done near your "sweet spot." This is the neutral, low-back arch, somewhere between fully rounded and fully arched, that is your least painful  position. When performed in this safe range of motion, these exercises can be used for people who have either a flexion or extension based injury. These exercises may resolve your symptoms with or without further involvement from your physician, physical therapist or athletic trainer. While completing these exercises, remember:  Muscles can gain both the endurance and the strength needed for everyday activities through controlled exercises. Complete these exercises as instructed by your physician, physical therapist or athletic trainer. Increase the resistance and repetitions only as guided. You may experience muscle soreness or fatigue, but the pain or discomfort you are trying to eliminate should never worsen during these exercises. If this pain does worsen, stop and make certain you are following the directions exactly. If the pain is still present after adjustments, discontinue the exercise until you can discuss the trouble with your caregiver.  STRENGTHENING - Deep Abdominals, Pelvic Tilt  Lie on a firm bed or floor.  Keeping your legs in front of you, bend your knees so they are both pointed toward the ceiling and your feet are flat on the floor. Tense your lower abdominal muscles to press your low back into the floor. This motion will rotate your pelvis so that your tail bone is scooping upwards rather than pointing at your feet or into the floor. With a gentle tension and even breathing, hold this position for 3 seconds. Repeat 2 times. Complete this exercise 3 times per week.   STRENGTHENING - Abdominals, Crunches  Lie on a firm bed or floor. Keeping your legs in front of you, bend your knees so they are both pointed toward the ceiling and your feet are flat on the floor. Cross your arms over your chest. Slightly tip your chin down without bending your neck. Tense your abdominals and slowly lift your trunk high enough to just clear your shoulder blades. Lifting higher can put excessive stress  on the lower back and does not further strengthen your abdominal muscles. Control your return to the starting position. Repeat 2 times. Complete this exercise 3 times per week.   STRENGTHENING - Quadruped, Opposite UE/LE Lift  Assume a hands and knees position on a firm surface. Keep your hands under your shoulders and your knees under your hips. You may place padding under your knees for comfort. Find your neutral spine and gently tense your abdominal muscles so that you can maintain this position. Your shoulders and hips should form a rectangle that is parallel with the floor and is not twisted. Keeping your trunk steady, lift your right hand no higher than your shoulder and then your left leg no higher than your hip. Make sure you are not holding your breath. Hold this position for 30 seconds. Continuing to keep your abdominal muscles tense and your back steady, slowly return to your starting position. Repeat with the opposite arm and leg. Repeat 2 times. Complete this exercise 3 times per week.   STRENGTHENING - Abdominals and Quadriceps, Straight Leg Raise  Lie on a firm bed or floor with both legs extended in front of you. Keeping one leg in contact with the floor, bend the other knee so that your foot can rest flat on the floor. Find your neutral spine, and tense your abdominal muscles to maintain your spinal position throughout the exercise. Slowly lift your straight leg off the floor about 6 inches for a count of 3, making sure to not hold your breath. Still keeping your neutral spine, slowly lower your leg all the way to the floor. Repeat this exercise with each leg 2 times. Complete this exercise 3 times per week.  POSTURE AND BODY MECHANICS CONSIDERATIONS - Low Back Sprain Keeping correct posture when sitting, standing or completing your activities will reduce the stress put on different body tissues, allowing injured tissues a chance to heal and limiting painful experiences. The  following are general guidelines for improved posture.  While reading these guidelines, remember: The exercises prescribed by your provider will help you have the flexibility and strength to maintain correct postures. The correct posture provides the best environment for your joints to work. All of your joints have less wear and tear when properly supported by a spine with good posture. This means you will experience a healthier, less painful body. Correct posture must be practiced with all of your activities, especially prolonged sitting and standing. Correct posture is as important when doing repetitive low-stress activities (typing) as it is when  doing a single heavy-load activity (lifting).  RESTING POSITIONS Consider which positions are most painful for you when choosing a resting position. If you have pain with flexion-based activities (sitting, bending, stooping, squatting), choose a position that allows you to rest in a less flexed posture. You would want to avoid curling into a fetal position on your side. If your pain worsens with extension-based activities (prolonged standing, working overhead), avoid resting in an extended position such as sleeping on your stomach. Most people will find more comfort when they rest with their spine in a more neutral position, neither too rounded nor too arched. Lying on a non-sagging bed on your side with a pillow between your knees, or on your back with a pillow under your knees will often provide some relief. Keep in mind, being in any one position for a prolonged period of time, no matter how correct your posture, can still lead to stiffness.  PROPER SITTING POSTURE In order to minimize stress and discomfort on your spine, you must sit with correct posture. Sitting with good posture should be effortless for a healthy body. Returning to good posture is a gradual process. Many people can work toward this most comfortably by using various supports until they have  the flexibility and strength to maintain this posture on their own. When sitting with proper posture, your ears will fall over your shoulders and your shoulders will fall over your hips. You should use the back of the chair to support your upper back. Your lower back will be in a neutral position, just slightly arched. You may place a small pillow or folded towel at the base of your lower back for  support.  When working at a desk, create an environment that supports good, upright posture. Without extra support, muscles tire, which leads to excessive strain on joints and other tissues. Keep these recommendations in mind:  CHAIR: A chair should be able to slide under your desk when your back makes contact with the back of the chair. This allows you to work closely. The chair's height should allow your eyes to be level with the upper part of your monitor and your hands to be slightly lower than your elbows.  BODY POSITION Your feet should make contact with the floor. If this is not possible, use a foot rest. Keep your ears over your shoulders. This will reduce stress on your neck and low back.  INCORRECT SITTING POSTURES  If you are feeling tired and unable to assume a healthy sitting posture, do not slouch or slump. This puts excessive strain on your back tissues, causing more damage and pain. Healthier options include: Using more support, like a lumbar pillow. Switching tasks to something that requires you to be upright or walking. Talking a brief walk. Lying down to rest in a neutral-spine position.  PROLONGED STANDING WHILE SLIGHTLY LEANING FORWARD  When completing a task that requires you to lean forward while standing in one place for a long time, place either foot up on a stationary 2-4 inch high object to help maintain the best posture. When both feet are on the ground, the lower back tends to lose its slight inward curve. If this curve flattens (or becomes too large), then the back and  your other joints will experience too much stress, tire more quickly, and can cause pain.  CORRECT STANDING POSTURES Proper standing posture should be assumed with all daily activities, even if they only take a few moments, like when brushing your  teeth. As in sitting, your ears should fall over your shoulders and your shoulders should fall over your hips. You should keep a slight tension in your abdominal muscles to brace your spine. Your tailbone should point down to the ground, not behind your body, resulting in an over-extended swayback posture.   INCORRECT STANDING POSTURES  Common incorrect standing postures include a forward head, locked knees and/or an excessive swayback. WALKING Walk with an upright posture. Your ears, shoulders and hips should all line-up.  PROLONGED ACTIVITY IN A FLEXED POSITION When completing a task that requires you to bend forward at your waist or lean over a low surface, try to find a way to stabilize 3 out of 4 of your limbs. You can place a hand or elbow on your thigh or rest a knee on the surface you are reaching across. This will provide you more stability, so that your muscles do not tire as quickly. By keeping your knees relaxed, or slightly bent, you will also reduce stress across your lower back. CORRECT LIFTING TECHNIQUES  DO : Assume a wide stance. This will provide you more stability and the opportunity to get as close as possible to the object which you are lifting. Tense your abdominals to brace your spine. Bend at the knees and hips. Keeping your back locked in a neutral-spine position, lift using your leg muscles. Lift with your legs, keeping your back straight. Test the weight of unknown objects before attempting to lift them. Try to keep your elbows locked down at your sides in order get the best strength from your shoulders when carrying an object.   Always ask for help when lifting heavy or awkward objects. INCORRECT LIFTING TECHNIQUES DO  NOT:  Lock your knees when lifting, even if it is a small object. Bend and twist. Pivot at your feet or move your feet when needing to change directions. Assume that you can safely pick up even a paperclip without proper posture.

## 2023-07-26 NOTE — Progress Notes (Signed)
 Subjective:   Chief Complaint  Patient presents with   Back Pain    Onset: 3 weeks    Elizabeth [name] is a 68 y.o. female here for follow-up of diabetes.   Elizabeth [name] does not monitor her sugars.  Patient does require insulin .   Medications include: diet controlled Diet is OK.  Exercise: walking  Hypertension Patient presents for hypertension follow up. She does monitor home blood pressures. Blood pressures ranging on average from 120-130's/70's. She is compliant with medications-Coreg  25 mg twice daily, chlorthalidone  25 mg daily, amlodipine  5 mg daily, olmesartan  40 mg daily, spironolactone  12.5 mg daily. Patient has these side effects of medication: none Diet/exercise as above. No CP or SOB.  Left low back pain that is sharp in nature starting 2 days ago.  No injury or change activity.  Seems to be getting worse.  Movement makes it worse.  No bruising, redness, swelling, or bowel/bladder changes.  She has been resting at home but has not tried anything else.  No neurologic signs or symptoms.  Past Medical History:  Diagnosis Date   Diabetes mellitus type 2 in obese    Hypertension      Related testing: Retinal exam: Due Pneumovax: done  Objective:  BP 130/88   Pulse 76   Temp 98.1 F (36.7 C)   Ht 5\' 1"  (1.549 m)   Wt 160 lb (72.6 kg)   SpO2 (!) 18%   PF 98 L/min   BMI 30.23 kg/m  General:  Well developed, well nourished, in no apparent distress Skin:  Warm, no pallor or diaphoresis Head:  Normocephalic, atraumatic Eyes:  Pupils equal and round, sclera anicteric without injection  Lungs:  CTAB, no access msc use Cardio:  RRR, no bruits, no LE edema Musculoskeletal:  Symmetrical muscle groups noted without atrophy or deformity Neuro:  Sensation intact to pinprick on feet Psych: Age appropriate judgment and insight  Assessment:   Type 2 diabetes mellitus with hyperglycemia, without long-term current use of insulin  (HCC) - Plan: Comprehensive metabolic panel  with GFR, Hemoglobin A1c, Microalbumin / creatinine urine ratio, Lipid panel, Ambulatory referral to Ophthalmology  Essential hypertension, benign  Acute left-sided low back pain without sciatica - Plan: methylPREDNISolone  acetate (DEPO-MEDROL ) injection 80 mg   Plan:   Chronic, hopefully stable.  Start Rybelsus 3 mg daily and increase to 7 mg after next month.  Refer to ophthalmology.  Check above.  Counseled on diet and exercise. Chronic, stable.  Continue Coreg  25 mg twice daily, chlorthalidone  25 mg daily, amlodipine  5 mg daily, olmesartan  40 mg daily, spironolactone  12.5 mg daily. Injection as above.  Robaxin 500 mg 3 times daily as needed.  Heat, ice, Tylenol, stretches and exercises.  Consider physical therapy if no improvement. F/u in 3-6 mo. The patient voiced understanding and agreement to the plan.  Shellie Dials [place], DO 07/26/23 12:03 PM

## 2023-07-26 NOTE — Telephone Encounter (Signed)
 Pharmacy Patient Advocate Encounter   Received notification from CoverMyMeds that prior authorization for Rybelsus 3MG  tablets is required/requested.   Insurance verification completed.   The patient is insured through [facility] .   Per test claim: PA required; PA submitted to above mentioned insurance via CoverMyMeds Key/confirmation #/EOC [id number] Status is pending

## 2023-07-29 ENCOUNTER — Other Ambulatory Visit (HOSPITAL_BASED_OUTPATIENT_CLINIC_OR_DEPARTMENT_OTHER): Payer: Self-pay

## 2023-07-29 NOTE — Telephone Encounter (Signed)
 Pharmacy Patient Advocate Encounter  Received notification from [facility] that Prior Authorization for Rybelsus  3MG  tablets has been DENIED.  Full denial letter will be uploaded to the media tab. See denial reason below.   PA #/Case ID/Reference #: [id number]  DENIED BECAUSE:  Your HbA1c (a blood test to check average blood sugar levels) > 6.5 Your fasting plasma glucose > 126mg /dL Your 2 hour plasma glucose > 200mg /dL during OGTT (oral glucose tolerance test) Your random plasma glucose > 200 mg/dL

## 2023-07-30 ENCOUNTER — Other Ambulatory Visit (HOSPITAL_BASED_OUTPATIENT_CLINIC_OR_DEPARTMENT_OTHER): Payer: Self-pay

## 2023-07-30 ENCOUNTER — Ambulatory Visit: Admitting: Family Medicine

## 2023-08-02 ENCOUNTER — Other Ambulatory Visit (HOSPITAL_BASED_OUTPATIENT_CLINIC_OR_DEPARTMENT_OTHER): Payer: Self-pay

## 2023-08-02 ENCOUNTER — Other Ambulatory Visit: Payer: Self-pay

## 2023-08-04 ENCOUNTER — Other Ambulatory Visit: Payer: Self-pay

## 2023-08-06 ENCOUNTER — Other Ambulatory Visit (HOSPITAL_BASED_OUTPATIENT_CLINIC_OR_DEPARTMENT_OTHER): Payer: Self-pay

## 2023-08-06 ENCOUNTER — Other Ambulatory Visit: Payer: Self-pay | Admitting: Family Medicine

## 2023-08-06 MED ORDER — AMLODIPINE BESYLATE 5 MG PO TABS
5.0000 mg | ORAL_TABLET | Freq: Two times a day (BID) | ORAL | 1 refills | Status: DC
  Filled 2023-08-06 – 2023-10-07 (×2): qty 180, 90d supply, fill #0
  Filled 2024-01-02: qty 180, 90d supply, fill #1

## 2023-08-07 ENCOUNTER — Other Ambulatory Visit (HOSPITAL_BASED_OUTPATIENT_CLINIC_OR_DEPARTMENT_OTHER): Payer: Self-pay

## 2023-08-15 ENCOUNTER — Other Ambulatory Visit (HOSPITAL_BASED_OUTPATIENT_CLINIC_OR_DEPARTMENT_OTHER): Payer: Self-pay

## 2023-08-23 ENCOUNTER — Other Ambulatory Visit (HOSPITAL_BASED_OUTPATIENT_CLINIC_OR_DEPARTMENT_OTHER): Payer: Self-pay

## 2023-08-26 ENCOUNTER — Other Ambulatory Visit (HOSPITAL_BASED_OUTPATIENT_CLINIC_OR_DEPARTMENT_OTHER): Payer: Self-pay

## 2023-09-06 ENCOUNTER — Other Ambulatory Visit (HOSPITAL_BASED_OUTPATIENT_CLINIC_OR_DEPARTMENT_OTHER): Payer: Self-pay

## 2023-09-09 ENCOUNTER — Other Ambulatory Visit: Payer: Self-pay | Admitting: Family Medicine

## 2023-09-09 ENCOUNTER — Other Ambulatory Visit: Payer: Self-pay

## 2023-09-09 ENCOUNTER — Other Ambulatory Visit (HOSPITAL_BASED_OUTPATIENT_CLINIC_OR_DEPARTMENT_OTHER): Payer: Self-pay

## 2023-09-09 MED ORDER — METHOCARBAMOL 500 MG PO TABS
500.0000 mg | ORAL_TABLET | Freq: Three times a day (TID) | ORAL | 0 refills | Status: AC | PRN
  Filled 2023-09-09: qty 20, 7d supply, fill #0

## 2023-09-10 ENCOUNTER — Other Ambulatory Visit (HOSPITAL_BASED_OUTPATIENT_CLINIC_OR_DEPARTMENT_OTHER): Payer: Self-pay

## 2023-09-10 ENCOUNTER — Other Ambulatory Visit: Payer: Self-pay

## 2023-09-11 ENCOUNTER — Encounter: Payer: Self-pay | Admitting: Family Medicine

## 2023-09-11 ENCOUNTER — Ambulatory Visit: Admitting: Family Medicine

## 2023-09-11 VITALS — BP 136/80 | HR 60 | Temp 98.0°F | Resp 16 | Ht 61.0 in | Wt 157.0 lb

## 2023-09-11 DIAGNOSIS — M545 Low back pain, unspecified: Secondary | ICD-10-CM

## 2023-09-11 MED ORDER — METHYLPREDNISOLONE ACETATE 40 MG/ML IJ SUSP
40.0000 mg | Freq: Once | INTRAMUSCULAR | Status: AC
  Administered 2023-09-11: 80 mg via INTRAMUSCULAR

## 2023-09-11 NOTE — Addendum Note (Signed)
 Addended by: ELOUISE POWELL HERO on: 09/11/2023 03:42 PM   Modules accepted: Orders

## 2023-09-11 NOTE — Progress Notes (Signed)
 Musculoskeletal Exam  Patient: Elizabeth [name] DOB: 04-26-1955  DOS: 09/11/2023  SUBJECTIVE:  Chief Complaint:   Chief Complaint  Patient presents with   Flank Pain    Right Flank Pain    Elizabeth [name] is a 68 y.o.  female for evaluation and treatment of back pain. Here w spouse who helps w hx.   Onset:  2 weeks ago. No inj or change in activity.  Location: lower R Character:  aching  Progression of issue:  is unchanged Associated symptoms: certain movements/positions seem to make things worse Denies bowel/bladder incontinence or weakness, bruising, redness, swelling Treatment: to date has been muscle relaxers.   Neurovascular symptoms: no  Past Medical History:  Diagnosis Date   Diabetes mellitus type 2 in obese    Hypertension     Objective:  VITAL SIGNS: BP 136/80 (BP Location: Left Arm, Patient Position: Sitting)   Pulse 60   Temp 98 F (36.7 C) (Oral)   Resp 16   Ht 5' 1 (1.549 m)   Wt 157 lb (71.2 kg)   SpO2 98%   BMI 29.66 kg/m  Constitutional: Well formed, well developed. No acute distress. HENT: Normocephalic, atraumatic.  Thorax & Lungs:  No accessory muscle use Musculoskeletal: low back.   Tenderness to palpation: yes- over lateral ES group on R Deformity: no Ecchymosis: no Straight leg test: negative for Poor hamstring flexibility b/l. Neurologic: Normal sensory function. No focal deficits noted. DTR's equal and symmetric in LE's. No clonus. Psychiatric: Normal mood. Age appropriate judgment and insight. Alert & oriented x 3.    Assessment:  Acute right-sided low back pain without sciatica  Plan: Stretches/exercises, heat, ice, Tylenol, Robaxin . Depo injection today 80 mg IM.  F/u prn. The patient and her spouse voiced understanding and agreement to the plan.   Mabel Mt [place], DO 09/11/23  3:14 PM

## 2023-09-11 NOTE — Patient Instructions (Signed)

## 2023-10-07 ENCOUNTER — Other Ambulatory Visit (HOSPITAL_BASED_OUTPATIENT_CLINIC_OR_DEPARTMENT_OTHER): Payer: Self-pay

## 2023-10-11 ENCOUNTER — Other Ambulatory Visit (HOSPITAL_BASED_OUTPATIENT_CLINIC_OR_DEPARTMENT_OTHER): Payer: Self-pay

## 2023-10-14 ENCOUNTER — Other Ambulatory Visit (HOSPITAL_BASED_OUTPATIENT_CLINIC_OR_DEPARTMENT_OTHER): Payer: Self-pay

## 2023-10-16 ENCOUNTER — Other Ambulatory Visit (HOSPITAL_BASED_OUTPATIENT_CLINIC_OR_DEPARTMENT_OTHER): Payer: Self-pay

## 2023-10-16 ENCOUNTER — Ambulatory Visit: Admitting: Family Medicine

## 2023-10-16 ENCOUNTER — Encounter: Payer: Self-pay | Admitting: Family Medicine

## 2023-10-16 VITALS — BP 126/74 | HR 58 | Temp 98.0°F | Resp 16 | Ht 61.0 in | Wt 155.0 lb

## 2023-10-16 DIAGNOSIS — K219 Gastro-esophageal reflux disease without esophagitis: Secondary | ICD-10-CM

## 2023-10-16 MED ORDER — PANTOPRAZOLE SODIUM 40 MG PO TBEC
40.0000 mg | DELAYED_RELEASE_TABLET | Freq: Every day | ORAL | 1 refills | Status: AC
  Filled 2023-10-16: qty 30, 30d supply, fill #0
  Filled 2023-11-08 – 2023-11-22 (×2): qty 30, 30d supply, fill #1
  Filled 2023-12-16: qty 30, 30d supply, fill #2
  Filled 2024-01-14 – 2024-01-27 (×2): qty 30, 30d supply, fill #3
  Filled 2024-02-19: qty 30, 30d supply, fill #4
  Filled 2024-03-10 – 2024-04-14 (×4): qty 30, 30d supply, fill #5

## 2023-10-16 MED ORDER — RABEPRAZOLE SODIUM 20 MG PO TBEC
20.0000 mg | DELAYED_RELEASE_TABLET | Freq: Every day | ORAL | 1 refills | Status: DC
  Filled 2023-10-16: qty 30, 30d supply, fill #0

## 2023-10-16 NOTE — Progress Notes (Signed)
 Chief Complaint  Patient presents with   Abdominal Pain    Abdominal Pain     Subjective Elizabeth [name] is a 68 y.o. female who presents with recurrent abd pain. Here w spouse who helps with the hx.  Symptoms began several years; happens 1-2 times per year. This time it started yesterday.  RUQ and epigastric region.  Sharp pain.  Patient has slight swelling. Seems to happen after meals and at night.  Patient denies vomiting, diarrhea, rashes, wt loss, and fever Treatment to date:  Sick contacts: none known  Past Medical History:  Diagnosis Date   Diabetes mellitus type 2 in obese    Hypertension     Exam BP 126/74 (BP Location: Left Arm, Patient Position: Sitting)   Pulse (!) 58   Temp 98 F (36.7 C) (Oral)   Resp 16   Ht 5' 1 (1.549 m)   Wt 155 lb (70.3 kg)   SpO2 98%   BMI 29.29 kg/m  General:  well developed, well hydrated, in no apparent distress Skin:  warm, no pallor or diaphoresis, no rashes Throat/Pharynx:  MMM Lungs:  clear to auscultation, breath sounds equal bilaterally, no respiratory distress, no wheezes Cardio:  Reg rhythm, bradycardic Abdomen:  abdomen soft, TTP in epigastric region; bowel sounds normal; no masses or organomegaly, neg Murphy's Psych: Appropriate judgement/insight  Assessment and Plan  Gastroesophageal reflux disease, unspecified whether esophagitis present - Plan: Ambulatory referral to Gastroenterology, pantoprazole  (PROTONIX ) 40 MG tablet  Exacerbation of chronic issue. Protonix  40 mg bid for 2 weeks and then weekly. Refer to GI given age and she has never had an EGD. Could also consider discussing CCS.  Avoid aggravating foods. F/u if symptoms fail to improve, sooner if worsening. The patient and her spouse voiced understanding and agreement to the plan.  Mabel Mt [place], DO 10/16/23  12:18 PM

## 2023-10-16 NOTE — Patient Instructions (Addendum)
 Take some metamucil with the twice daily Aciphex .  For the next 2 weeks, take the Aciphex  twice daily.   If you do not hear anything about your referral in the next 1-2 weeks, call our office and ask for an update.  The only lifestyle changes that have data behind them are weight loss for the overweight/obese and elevating the head of the bed. Finding out which foods/positions are triggers is important.  Pepcid/famotidine 20 mg 1-2 times daily can help with reflux symptoms.    Let us  know if you need anything.

## 2023-11-04 ENCOUNTER — Other Ambulatory Visit: Payer: Self-pay | Admitting: Family Medicine

## 2023-11-04 ENCOUNTER — Other Ambulatory Visit (HOSPITAL_BASED_OUTPATIENT_CLINIC_OR_DEPARTMENT_OTHER): Payer: Self-pay

## 2023-11-04 MED ORDER — GUANFACINE HCL 2 MG PO TABS
2.0000 mg | ORAL_TABLET | Freq: Every day | ORAL | 3 refills | Status: DC
  Filled 2023-11-05: qty 30, 30d supply, fill #0
  Filled 2023-12-15: qty 30, 30d supply, fill #1
  Filled 2024-01-12: qty 30, 30d supply, fill #2
  Filled 2024-02-12: qty 30, 30d supply, fill #3

## 2023-11-05 ENCOUNTER — Other Ambulatory Visit (HOSPITAL_BASED_OUTPATIENT_CLINIC_OR_DEPARTMENT_OTHER): Payer: Self-pay

## 2023-11-06 ENCOUNTER — Other Ambulatory Visit (HOSPITAL_BASED_OUTPATIENT_CLINIC_OR_DEPARTMENT_OTHER): Payer: Self-pay

## 2023-11-07 ENCOUNTER — Other Ambulatory Visit (HOSPITAL_BASED_OUTPATIENT_CLINIC_OR_DEPARTMENT_OTHER): Payer: Self-pay

## 2023-11-08 ENCOUNTER — Other Ambulatory Visit (HOSPITAL_BASED_OUTPATIENT_CLINIC_OR_DEPARTMENT_OTHER): Payer: Self-pay

## 2023-11-12 ENCOUNTER — Other Ambulatory Visit (HOSPITAL_BASED_OUTPATIENT_CLINIC_OR_DEPARTMENT_OTHER): Payer: Self-pay

## 2023-11-13 ENCOUNTER — Other Ambulatory Visit (HOSPITAL_BASED_OUTPATIENT_CLINIC_OR_DEPARTMENT_OTHER): Payer: Self-pay

## 2023-11-14 ENCOUNTER — Other Ambulatory Visit (HOSPITAL_BASED_OUTPATIENT_CLINIC_OR_DEPARTMENT_OTHER): Payer: Self-pay

## 2023-11-15 ENCOUNTER — Other Ambulatory Visit (HOSPITAL_BASED_OUTPATIENT_CLINIC_OR_DEPARTMENT_OTHER): Payer: Self-pay

## 2023-11-18 ENCOUNTER — Other Ambulatory Visit (HOSPITAL_BASED_OUTPATIENT_CLINIC_OR_DEPARTMENT_OTHER): Payer: Self-pay

## 2023-11-19 ENCOUNTER — Other Ambulatory Visit (HOSPITAL_BASED_OUTPATIENT_CLINIC_OR_DEPARTMENT_OTHER): Payer: Self-pay

## 2023-11-20 ENCOUNTER — Other Ambulatory Visit (HOSPITAL_BASED_OUTPATIENT_CLINIC_OR_DEPARTMENT_OTHER): Payer: Self-pay

## 2023-11-21 ENCOUNTER — Other Ambulatory Visit (HOSPITAL_BASED_OUTPATIENT_CLINIC_OR_DEPARTMENT_OTHER): Payer: Self-pay

## 2023-11-22 ENCOUNTER — Ambulatory Visit

## 2023-11-22 ENCOUNTER — Other Ambulatory Visit (HOSPITAL_BASED_OUTPATIENT_CLINIC_OR_DEPARTMENT_OTHER): Payer: Self-pay

## 2023-11-22 DIAGNOSIS — Z23 Encounter for immunization: Secondary | ICD-10-CM

## 2023-11-22 NOTE — Progress Notes (Signed)
 Patient here today for shingles vaccine per Dr.Wendling , DO orders  Patient tolerated injection well

## 2023-11-25 ENCOUNTER — Other Ambulatory Visit (HOSPITAL_BASED_OUTPATIENT_CLINIC_OR_DEPARTMENT_OTHER): Payer: Self-pay

## 2023-11-26 ENCOUNTER — Other Ambulatory Visit (HOSPITAL_BASED_OUTPATIENT_CLINIC_OR_DEPARTMENT_OTHER): Payer: Self-pay

## 2023-11-27 ENCOUNTER — Other Ambulatory Visit (HOSPITAL_BASED_OUTPATIENT_CLINIC_OR_DEPARTMENT_OTHER): Payer: Self-pay

## 2023-11-28 ENCOUNTER — Other Ambulatory Visit (HOSPITAL_BASED_OUTPATIENT_CLINIC_OR_DEPARTMENT_OTHER): Payer: Self-pay

## 2023-11-29 ENCOUNTER — Other Ambulatory Visit (HOSPITAL_BASED_OUTPATIENT_CLINIC_OR_DEPARTMENT_OTHER): Payer: Self-pay

## 2023-12-02 ENCOUNTER — Other Ambulatory Visit (HOSPITAL_BASED_OUTPATIENT_CLINIC_OR_DEPARTMENT_OTHER): Payer: Self-pay

## 2023-12-16 ENCOUNTER — Encounter: Payer: Self-pay | Admitting: Family Medicine

## 2023-12-16 ENCOUNTER — Other Ambulatory Visit (HOSPITAL_BASED_OUTPATIENT_CLINIC_OR_DEPARTMENT_OTHER): Payer: Self-pay

## 2023-12-17 ENCOUNTER — Other Ambulatory Visit (HOSPITAL_BASED_OUTPATIENT_CLINIC_OR_DEPARTMENT_OTHER): Payer: Self-pay

## 2023-12-18 ENCOUNTER — Other Ambulatory Visit (HOSPITAL_BASED_OUTPATIENT_CLINIC_OR_DEPARTMENT_OTHER): Payer: Self-pay

## 2023-12-19 ENCOUNTER — Other Ambulatory Visit (HOSPITAL_BASED_OUTPATIENT_CLINIC_OR_DEPARTMENT_OTHER): Payer: Self-pay

## 2023-12-20 ENCOUNTER — Other Ambulatory Visit (HOSPITAL_BASED_OUTPATIENT_CLINIC_OR_DEPARTMENT_OTHER): Payer: Self-pay

## 2023-12-23 ENCOUNTER — Other Ambulatory Visit (HOSPITAL_BASED_OUTPATIENT_CLINIC_OR_DEPARTMENT_OTHER): Payer: Self-pay

## 2023-12-24 ENCOUNTER — Other Ambulatory Visit (HOSPITAL_BASED_OUTPATIENT_CLINIC_OR_DEPARTMENT_OTHER): Payer: Self-pay

## 2024-01-03 ENCOUNTER — Other Ambulatory Visit (HOSPITAL_BASED_OUTPATIENT_CLINIC_OR_DEPARTMENT_OTHER): Payer: Self-pay

## 2024-01-12 ENCOUNTER — Other Ambulatory Visit: Payer: Self-pay | Admitting: Family Medicine

## 2024-01-12 ENCOUNTER — Other Ambulatory Visit: Payer: Self-pay | Admitting: Internal Medicine

## 2024-01-13 ENCOUNTER — Other Ambulatory Visit (HOSPITAL_BASED_OUTPATIENT_CLINIC_OR_DEPARTMENT_OTHER): Payer: Self-pay

## 2024-01-13 ENCOUNTER — Other Ambulatory Visit: Payer: Self-pay

## 2024-01-13 ENCOUNTER — Telehealth: Payer: Self-pay | Admitting: Internal Medicine

## 2024-01-13 DIAGNOSIS — I10 Essential (primary) hypertension: Secondary | ICD-10-CM

## 2024-01-13 MED ORDER — AMLODIPINE BESYLATE 5 MG PO TABS
5.0000 mg | ORAL_TABLET | Freq: Two times a day (BID) | ORAL | 1 refills | Status: AC
  Filled 2024-01-13 – 2024-04-01 (×2): qty 180, 90d supply, fill #0

## 2024-01-13 MED ORDER — OLMESARTAN MEDOXOMIL 40 MG PO TABS
20.0000 mg | ORAL_TABLET | Freq: Two times a day (BID) | ORAL | 1 refills | Status: AC
  Filled 2024-01-13 – 2024-03-10 (×3): qty 90, 90d supply, fill #0

## 2024-01-13 NOTE — Telephone Encounter (Signed)
*  STAT* If patient is at the pharmacy, call can be transferred to refill team.   1. Which medications need to be refilled? (please list name of each medication and dose if known) olmesartan  (BENICAR ) 40 MG tablet   2. Which pharmacy/location (including street and city if local pharmacy) is medication to be sent to?  [facility] - [facility]    3. Do they need a 30 day or 90 day supply? 90

## 2024-01-14 ENCOUNTER — Other Ambulatory Visit (HOSPITAL_BASED_OUTPATIENT_CLINIC_OR_DEPARTMENT_OTHER): Payer: Self-pay

## 2024-01-20 ENCOUNTER — Other Ambulatory Visit (HOSPITAL_BASED_OUTPATIENT_CLINIC_OR_DEPARTMENT_OTHER): Payer: Self-pay

## 2024-01-20 ENCOUNTER — Other Ambulatory Visit: Payer: Self-pay

## 2024-01-21 ENCOUNTER — Other Ambulatory Visit: Payer: Self-pay | Admitting: Family Medicine

## 2024-01-21 ENCOUNTER — Other Ambulatory Visit (HOSPITAL_BASED_OUTPATIENT_CLINIC_OR_DEPARTMENT_OTHER): Payer: Self-pay

## 2024-01-21 DIAGNOSIS — J309 Allergic rhinitis, unspecified: Secondary | ICD-10-CM

## 2024-01-21 MED ORDER — FLUTICASONE PROPIONATE 50 MCG/ACT NA SUSP
2.0000 | Freq: Every day | NASAL | 6 refills | Status: AC
  Filled 2024-01-21: qty 16, 30d supply, fill #0
  Filled 2024-03-10: qty 16, 30d supply, fill #1

## 2024-01-22 ENCOUNTER — Other Ambulatory Visit (HOSPITAL_BASED_OUTPATIENT_CLINIC_OR_DEPARTMENT_OTHER): Payer: Self-pay

## 2024-01-22 ENCOUNTER — Encounter: Payer: Self-pay | Admitting: Family Medicine

## 2024-01-22 DIAGNOSIS — Z1231 Encounter for screening mammogram for malignant neoplasm of breast: Secondary | ICD-10-CM

## 2024-01-23 ENCOUNTER — Other Ambulatory Visit (HOSPITAL_BASED_OUTPATIENT_CLINIC_OR_DEPARTMENT_OTHER): Payer: Self-pay

## 2024-01-24 ENCOUNTER — Other Ambulatory Visit (HOSPITAL_BASED_OUTPATIENT_CLINIC_OR_DEPARTMENT_OTHER): Payer: Self-pay

## 2024-01-29 ENCOUNTER — Other Ambulatory Visit (HOSPITAL_BASED_OUTPATIENT_CLINIC_OR_DEPARTMENT_OTHER): Payer: Self-pay

## 2024-01-30 ENCOUNTER — Other Ambulatory Visit (HOSPITAL_BASED_OUTPATIENT_CLINIC_OR_DEPARTMENT_OTHER): Payer: Self-pay

## 2024-01-31 ENCOUNTER — Other Ambulatory Visit (HOSPITAL_BASED_OUTPATIENT_CLINIC_OR_DEPARTMENT_OTHER): Payer: Self-pay

## 2024-02-01 ENCOUNTER — Ambulatory Visit (HOSPITAL_BASED_OUTPATIENT_CLINIC_OR_DEPARTMENT_OTHER)

## 2024-02-02 ENCOUNTER — Ambulatory Visit (HOSPITAL_BASED_OUTPATIENT_CLINIC_OR_DEPARTMENT_OTHER)

## 2024-02-04 ENCOUNTER — Ambulatory Visit (HOSPITAL_BASED_OUTPATIENT_CLINIC_OR_DEPARTMENT_OTHER)
Admission: RE | Admit: 2024-02-04 | Discharge: 2024-02-04 | Disposition: A | Discharge status: Home / Self Care | Source: Ambulatory Visit | Attending: Family Medicine | Admitting: Family Medicine

## 2024-02-04 ENCOUNTER — Encounter (HOSPITAL_BASED_OUTPATIENT_CLINIC_OR_DEPARTMENT_OTHER): Payer: Self-pay

## 2024-02-04 ENCOUNTER — Encounter (HOSPITAL_BASED_OUTPATIENT_CLINIC_OR_DEPARTMENT_OTHER): Payer: Self-pay | Admitting: Family Medicine

## 2024-02-04 DIAGNOSIS — E2839 Other primary ovarian failure: Secondary | ICD-10-CM

## 2024-02-04 DIAGNOSIS — Z1231 Encounter for screening mammogram for malignant neoplasm of breast: Secondary | ICD-10-CM | POA: Diagnosis present

## 2024-02-12 ENCOUNTER — Encounter: Payer: Self-pay | Admitting: Family Medicine

## 2024-02-12 ENCOUNTER — Other Ambulatory Visit (HOSPITAL_BASED_OUTPATIENT_CLINIC_OR_DEPARTMENT_OTHER): Payer: Self-pay

## 2024-02-12 ENCOUNTER — Ambulatory Visit: Admitting: Family Medicine

## 2024-02-12 VITALS — BP 128/78 | HR 62 | Temp 98.0°F | Resp 16 | Ht 61.0 in | Wt 163.5 lb

## 2024-02-12 DIAGNOSIS — H93A2 Pulsatile tinnitus, left ear: Secondary | ICD-10-CM | POA: Diagnosis not present

## 2024-02-12 DIAGNOSIS — E1165 Type 2 diabetes mellitus with hyperglycemia: Secondary | ICD-10-CM

## 2024-02-12 DIAGNOSIS — R8290 Unspecified abnormal findings in urine: Secondary | ICD-10-CM

## 2024-02-12 DIAGNOSIS — Z7984 Long term (current) use of oral hypoglycemic drugs: Secondary | ICD-10-CM | POA: Diagnosis not present

## 2024-02-12 DIAGNOSIS — Z1211 Encounter for screening for malignant neoplasm of colon: Secondary | ICD-10-CM

## 2024-02-12 DIAGNOSIS — I10 Essential (primary) hypertension: Secondary | ICD-10-CM

## 2024-02-12 LAB — POCT URINALYSIS DIPSTICK
Bilirubin, UA: NEGATIVE
Blood, UA: NEGATIVE
Glucose, UA: NEGATIVE
Ketones, UA: NEGATIVE
Leukocytes, UA: NEGATIVE
Nitrite, UA: NEGATIVE
Protein, UA: NEGATIVE
Spec Grav, UA: <=1.005 — AB (ref 1.010–1.025)
Urobilinogen, UA: 0.2 U/dL
pH, UA: 6.5 (ref 5.0–8.0)

## 2024-02-12 NOTE — Patient Instructions (Addendum)
 Give us  2-3 business days to get the results of your labs back.   Keep the diet clean and stay active.  We need to get you in for a scan of your head.   Someone will send you a kit for the Cologuard.  Stay hydrated.  Take Metamucil or Benefiber daily.  Let us  know if you need anything.

## 2024-02-12 NOTE — Progress Notes (Signed)
 Subjective:   Chief Complaint  Patient presents with   Dysuria    Dysuria     Elizabeth [name] is a 68 y.o. female here for follow-up of diabetes.  Here with her spouse who helps interpret. Elizabeth [name] does not routinely ck her sugars.  Patient does not require insulin .   Medications include: Rybelsus  7 mg daily Diet is fair.  Exercise: walking, active in yard  Hypertension Patient presents for hypertension follow up. She does monitor home blood pressures. Blood pressures ranging on average from 120-130's/70's. She is compliant with medications-spironolactone  12.5 mg daily, chlorthalidone  25 mg daily, Coreg  25 mg twice daily, amlodipine  5 mg twice daily, Benicar  40 mg twice daily.. Patient has these side effects of medication: none Diet/exercise as above. No chest pain or shortness of breath.  Patient has had a whooshing in her ear over the past 2 weeks.  It is located on the left side.  It resembles a heartbeat.  She has no pain, drainage, illness, hearing loss, and denies any trauma.   Past Medical History:  Diagnosis Date   Diabetes mellitus type 2 in obese    Hypertension      Related testing: Retinal exam: Done Pneumovax: done  Objective:  BP 128/78 (BP Location: Left Arm, Patient Position: Sitting)   Pulse 62   Temp 98 F (36.7 C) (Oral)   Resp 16   Ht 5' 1 (1.549 m)   Wt 163 lb 8 oz (74.2 kg)   SpO2 100%   BMI 30.89 kg/m  General:  Well developed, well nourished, in no apparent distress Skin:  Warm, no pallor or diaphoresis Ear: Left ear is patent without otorrhea, TM negative Lungs:  CTAB, no access msc use Cardio:  RRR, no bruits, no LE edema Psych: Age appropriate judgment and insight  Assessment:   Type 2 diabetes mellitus with hyperglycemia, without long-term current use of insulin  (HCC) - Plan: Comprehensive metabolic panel with GFR, Lipid panel, Hemoglobin A1c  Essential hypertension, benign  Pulsatile tinnitus of left ear - Plan: MR Angiogram  Head Wo Contrast  Screening for colon cancer - Plan: Cologuard   Plan:   Chronic, Stable.  Continue Rybelsus  7 mg daily counseled on diet and exercise. Chronic, stable. Cont olmesartan  20 mg twice daily, amlodipine  5 mg twice daily, Coreg  25 mg twice daily, spironolactone  12.5 mg daily, chlorthalidone  25 mg daily. Check MRA brain to rule out vascular etiology. Will set up Cologuard again. Flu shot politely declined.  F/u in 6 mo. The patient voiced understanding and agreement to the plan.  Elizabeth [name] [place], DO 02/12/24 1:13 PM

## 2024-02-13 ENCOUNTER — Ambulatory Visit: Payer: Self-pay | Admitting: Family Medicine

## 2024-02-13 ENCOUNTER — Other Ambulatory Visit (HOSPITAL_BASED_OUTPATIENT_CLINIC_OR_DEPARTMENT_OTHER): Payer: Self-pay

## 2024-02-13 LAB — COMPREHENSIVE METABOLIC PANEL WITH GFR
ALT: 14 U/L (ref 0–35)
AST: 15 U/L (ref 0–37)
Albumin: 4.1 g/dL (ref 3.5–5.2)
Alkaline Phosphatase: 64 U/L (ref 39–117)
BUN: 18 mg/dL (ref 6–23)
CO2: 31 meq/L (ref 19–32)
Calcium: 9.6 mg/dL (ref 8.4–10.5)
Chloride: 100 meq/L (ref 96–112)
Creatinine, Ser: 0.84 mg/dL (ref 0.40–1.20)
GFR: 71.74 mL/min (ref >60.00)
Glucose, Bld: 112 mg/dL — ABNORMAL HIGH (ref 70–99)
Potassium: 4.2 meq/L (ref 3.5–5.1)
Sodium: 138 meq/L (ref 135–145)
Total Bilirubin: 0.5 mg/dL (ref 0.2–1.2)
Total Protein: 6.4 g/dL (ref 6.0–8.3)

## 2024-02-13 LAB — URINE CULTURE
MICRO NUMBER:: 17307392
Result:: NO GROWTH
SPECIMEN QUALITY:: ADEQUATE

## 2024-02-13 LAB — LIPID PANEL
Cholesterol: 135 mg/dL (ref 0–200)
HDL: 64.7 mg/dL (ref >39.00)
LDL Cholesterol: 42 mg/dL (ref 0–99)
NonHDL: 70.45
Total CHOL/HDL Ratio: 2
Triglycerides: 142 mg/dL (ref 0.0–149.0)
VLDL: 28.4 mg/dL (ref 0.0–40.0)

## 2024-02-13 LAB — HEMOGLOBIN A1C: Hgb A1c MFr Bld: 6.6 % — ABNORMAL HIGH (ref 4.6–6.5)

## 2024-03-01 ENCOUNTER — Ambulatory Visit (HOSPITAL_BASED_OUTPATIENT_CLINIC_OR_DEPARTMENT_OTHER)

## 2024-03-09 LAB — COLOGUARD: COLOGUARD: NEGATIVE

## 2024-03-10 ENCOUNTER — Other Ambulatory Visit: Payer: Self-pay | Admitting: Family Medicine

## 2024-03-10 ENCOUNTER — Other Ambulatory Visit: Payer: Self-pay

## 2024-03-10 ENCOUNTER — Other Ambulatory Visit (HOSPITAL_BASED_OUTPATIENT_CLINIC_OR_DEPARTMENT_OTHER): Payer: Self-pay

## 2024-03-10 MED ORDER — GUANFACINE HCL 2 MG PO TABS
2.0000 mg | ORAL_TABLET | Freq: Every day | ORAL | 3 refills | Status: AC
  Filled 2024-03-10: qty 30, 30d supply, fill #0

## 2024-03-20 ENCOUNTER — Other Ambulatory Visit (HOSPITAL_BASED_OUTPATIENT_CLINIC_OR_DEPARTMENT_OTHER): Payer: Self-pay

## 2024-03-27 ENCOUNTER — Other Ambulatory Visit (HOSPITAL_BASED_OUTPATIENT_CLINIC_OR_DEPARTMENT_OTHER): Payer: Self-pay

## 2024-03-30 ENCOUNTER — Other Ambulatory Visit (HOSPITAL_BASED_OUTPATIENT_CLINIC_OR_DEPARTMENT_OTHER): Payer: Self-pay

## 2024-04-01 ENCOUNTER — Other Ambulatory Visit (HOSPITAL_BASED_OUTPATIENT_CLINIC_OR_DEPARTMENT_OTHER): Payer: Self-pay

## 2024-04-13 ENCOUNTER — Other Ambulatory Visit (HOSPITAL_BASED_OUTPATIENT_CLINIC_OR_DEPARTMENT_OTHER): Payer: Self-pay

## 2024-04-15 ENCOUNTER — Other Ambulatory Visit (HOSPITAL_BASED_OUTPATIENT_CLINIC_OR_DEPARTMENT_OTHER): Payer: Self-pay

## 2024-04-16 ENCOUNTER — Other Ambulatory Visit: Payer: Self-pay | Admitting: Internal Medicine

## 2024-04-17 ENCOUNTER — Other Ambulatory Visit (HOSPITAL_BASED_OUTPATIENT_CLINIC_OR_DEPARTMENT_OTHER): Payer: Self-pay

## 2024-04-17 MED ORDER — ROSUVASTATIN CALCIUM 40 MG PO TABS: 40.0000 mg | ORAL_TABLET | Freq: Every day | ORAL | 0 refills | Status: AC
